# Patient Record
Sex: Male | Born: 1990 | Race: White | Hispanic: No | Marital: Single | Smoking: Former smoker
Health system: Southern US, Community
[De-identification: ages and names within clinical notes are randomized; demographics above are authoritative.]

## PROBLEM LIST (undated history)

## (undated) DIAGNOSIS — M2669 Other specified disorders of temporomandibular joint: Secondary | ICD-10-CM

## (undated) DIAGNOSIS — J342 Deviated nasal septum: Secondary | ICD-10-CM

## (undated) DIAGNOSIS — R0989 Other specified symptoms and signs involving the circulatory and respiratory systems: Secondary | ICD-10-CM

## (undated) DIAGNOSIS — E039 Hypothyroidism, unspecified: Secondary | ICD-10-CM

---

## 2010-04-22 HISTORY — PX: WISDOM TOOTH EXTRACTION: SHX21

## 2010-06-29 ENCOUNTER — Other Ambulatory Visit: Payer: Self-pay | Admitting: Family Medicine

## 2010-06-29 ENCOUNTER — Ambulatory Visit
Admission: RE | Admit: 2010-06-29 | Discharge: 2010-06-29 | Disposition: A | Discharge status: Home / Self Care | Payer: 59 | Source: Ambulatory Visit | Attending: Family Medicine | Admitting: Family Medicine

## 2010-06-29 DIAGNOSIS — M954 Acquired deformity of chest and rib: Secondary | ICD-10-CM

## 2010-07-02 ENCOUNTER — Other Ambulatory Visit (HOSPITAL_COMMUNITY): Payer: Self-pay | Admitting: Family Medicine

## 2010-07-02 DIAGNOSIS — Q766 Other congenital malformations of ribs: Secondary | ICD-10-CM

## 2010-07-06 ENCOUNTER — Ambulatory Visit (HOSPITAL_COMMUNITY)
Admission: RE | Admit: 2010-07-06 | Discharge: 2010-07-06 | Disposition: A | Discharge status: Home / Self Care | Payer: 59 | Source: Ambulatory Visit | Attending: Family Medicine | Admitting: Family Medicine

## 2010-07-06 DIAGNOSIS — Q766 Other congenital malformations of ribs: Secondary | ICD-10-CM

## 2010-07-06 DIAGNOSIS — R222 Localized swelling, mass and lump, trunk: Secondary | ICD-10-CM | POA: Insufficient documentation

## 2010-08-23 ENCOUNTER — Other Ambulatory Visit: Payer: Self-pay | Admitting: Family Medicine

## 2010-08-23 DIAGNOSIS — N62 Hypertrophy of breast: Secondary | ICD-10-CM

## 2010-08-24 ENCOUNTER — Ambulatory Visit
Admission: RE | Admit: 2010-08-24 | Discharge: 2010-08-24 | Disposition: A | Discharge status: Home / Self Care | Payer: 59 | Source: Ambulatory Visit | Attending: Family Medicine | Admitting: Family Medicine

## 2010-08-24 DIAGNOSIS — N62 Hypertrophy of breast: Secondary | ICD-10-CM

## 2011-02-27 ENCOUNTER — Ambulatory Visit (INDEPENDENT_AMBULATORY_CARE_PROVIDER_SITE_OTHER): Payer: Commercial Managed Care - PPO | Admitting: Surgery

## 2011-02-27 ENCOUNTER — Encounter (INDEPENDENT_AMBULATORY_CARE_PROVIDER_SITE_OTHER): Payer: Self-pay | Admitting: Surgery

## 2011-02-27 ENCOUNTER — Other Ambulatory Visit (INDEPENDENT_AMBULATORY_CARE_PROVIDER_SITE_OTHER): Payer: Self-pay | Admitting: Surgery

## 2011-02-27 VITALS — BP 116/68 | HR 68 | Temp 97.8°F | Resp 16 | Ht 71.0 in | Wt 136.8 lb

## 2011-02-27 DIAGNOSIS — N63 Unspecified lump in unspecified breast: Secondary | ICD-10-CM

## 2011-02-27 DIAGNOSIS — N6320 Unspecified lump in the left breast, unspecified quadrant: Secondary | ICD-10-CM | POA: Insufficient documentation

## 2011-02-27 NOTE — H&P (Signed)
Chief Complaint   Patient presents with   .  Other       Eval of left side gyne        HPI [name] is a 20 y.o. male.  He presents with a one-year history of an enlarging left breast mass. This area is minimally tender. It is mostly tender to palpation. The patient does have some asymmetry to his chest with some pectus and some tilting of his sternum. However he seems to have more fullness behind his left nipple. [facility] This shows only hypoechoic fibroglandular subareolar tissue with no mass or fat necrosis. He presents now for evaluation for removal of this breast mass. HPI   History reviewed. No pertinent past medical history.    Past Surgical History   Procedure  Date   .  Wisdom tooth extraction  2012         Family History   Problem  Relation  Age of Onset   .  Cancer  Maternal Uncle         colon,liver        Social History History   Substance Use Topics   .  Smoking status:  Passive Smoker   .  Smokeless tobacco:  Never Used   .  Alcohol Use:  No         Allergies   Allergen  Reactions   .  Penicillins  Rash         No current outpatient prescriptions on file.        Review of Systems Review of Systems ROS reviewed and is overall negative Blood pressure 116/68, pulse 68, temperature 97.8 F (36.6 C), temperature source Temporal, resp. rate 16, height 5' 11" (1.803 m), weight 136 lb 12.8 oz (62.052 kg).   Physical Exam Physical Exam WDWN in NAD HEENT:  EOMI, sclera anicteric Neck:  No masses, no thyromegaly Lungs:  CTA bilaterally; normal respiratory effort Chest: obvious asymmetry with anterior protrusion of the left chest.  There is also some soft tissue asymmetry with some more fullness behind the left areola.  At 10 o'clock in the retroareolar location of the left chest, there is a 1 cm firm palpable mass.  It seems well-demarcated with no overlying skin changes. CV:   Regular rate and rhythm; no murmurs Abd:  +bowel sounds, soft, non-tender, no masses Ext:  Well-perfused; no edema Skin:  Warm, dry; no sign of jaundice   Data Reviewed L breast ultrasound   Assessment    Left breast mass     Plan     Recommend left subcutaneous mastectomy.  The surgical procedure has been discussed with the patient.  Potential risks, benefits, alternative treatments, and expected outcomes have been explained.  All of the patient's questions at this time have been answered.  The likelihood of reaching the patient's treatment goal is good.  The patient understand the proposed surgical procedure and wishes to proceed.            [name] K. 02/27/2011, 4:22 PM             

## 2011-02-27 NOTE — Progress Notes (Signed)
Chief Complaint   Patient presents with   .  Other       Eval of left side gyne        HPI [name] is a 20 y.o. male.  He presents with a one-year history of an enlarging left breast mass. This area is minimally tender. It is mostly tender to palpation. The patient does have some asymmetry to his chest with some pectus and some tilting of his sternum. However he seems to have more fullness behind his left nipple. [facility] This shows only hypoechoic fibroglandular subareolar tissue with no mass or fat necrosis. He presents now for evaluation for removal of this breast mass. HPI   History reviewed. No pertinent past medical history.    Past Surgical History   Procedure  Date   .  Wisdom tooth extraction  2012         Family History   Problem  Relation  Age of Onset   .  Cancer  Maternal Uncle         colon,liver        Social History History   Substance Use Topics   .  Smoking status:  Passive Smoker   .  Smokeless tobacco:  Never Used   .  Alcohol Use:  No         Allergies   Allergen  Reactions   .  Penicillins  Rash         No current outpatient prescriptions on file.        Review of Systems Review of Systems ROS reviewed and is overall negative Blood pressure 116/68, pulse 68, temperature 97.8 F (36.6 C), temperature source Temporal, resp. rate 16, height 5' 11" (1.803 m), weight 136 lb 12.8 oz (62.052 kg).   Physical Exam Physical Exam WDWN in NAD HEENT:  EOMI, sclera anicteric Neck:  No masses, no thyromegaly Lungs:  CTA bilaterally; normal respiratory effort Chest: obvious asymmetry with anterior protrusion of the left chest.  There is also some soft tissue asymmetry with some more fullness behind the left areola.  At 10 o'clock in the retroareolar location of the left chest, there is a 1 cm firm palpable mass.  It seems well-demarcated with no overlying skin changes. CV:   Regular rate and rhythm; no murmurs Abd:  +bowel sounds, soft, non-tender, no masses Ext:  Well-perfused; no edema Skin:  Warm, dry; no sign of jaundice   Data Reviewed L breast ultrasound   Assessment    Left breast mass     Plan     Recommend left subcutaneous mastectomy.  The surgical procedure has been discussed with the patient.  Potential risks, benefits, alternative treatments, and expected outcomes have been explained.  All of the patient's questions at this time have been answered.  The likelihood of reaching the patient's treatment goal is good.  The patient understand the proposed surgical procedure and wishes to proceed.            Avagrace Botelho K. 02/27/2011, 4:22 PM             

## 2011-03-19 ENCOUNTER — Encounter (HOSPITAL_COMMUNITY)
Admission: RE | Admit: 2011-03-19 | Discharge: 2011-03-19 | Disposition: A | Discharge status: Home / Self Care | Payer: 59 | Source: Ambulatory Visit | Attending: Surgery | Admitting: Surgery

## 2011-03-19 ENCOUNTER — Encounter (HOSPITAL_COMMUNITY): Payer: Self-pay

## 2011-03-19 LAB — SURGICAL PCR SCREEN
MRSA, PCR: NEGATIVE
Staphylococcus aureus: POSITIVE — AB

## 2011-03-19 LAB — CBC
HCT: 43.1 % (ref 39.0–52.0)
Hemoglobin: 15.6 g/dL (ref 13.0–17.0)
RDW: 12.4 % (ref 11.5–15.5)
WBC: 5.7 10*3/uL (ref 4.0–10.5)

## 2011-03-19 NOTE — Pre-Procedure Instructions (Signed)
[name]  03/19/2011   Your procedure is scheduled on:  03/22/11  Report to [id number] AM.  Call this number if you have problems the morning of surgery: [phone]   Remember:   Do not eat food:After Midnight.  May have clear liquids: up to 4 Hours before arrival.  Clear liquids include soda, tea, black coffee, apple or grape juice, broth.  Take these medicines the morning of surgery with A SIP OF WATER: none   Do not wear jewelry, make-up or nail polish.  Do not wear lotions, powders, or perfumes. You may wear deodorant.  Do not shave 48 hours prior to surgery.  Do not bring valuables to the hospital.  Contacts, dentures or bridgework may not be worn into surgery.  Leave suitcase in the car. After surgery it may be brought to your room.  For patients admitted to the hospital, checkout time is 11:00 AM the day of discharge.   Patients discharged the day of surgery will not be allowed to drive home.  Name and phone number of your driver: douglas father [phone]  Special Instructions: CHG Shower Use Special Wash: 1/2 bottle night before surgery and 1/2 bottle morning of surgery.   Please read over the following fact sheets that you were given: Pain Booklet, Coughing and Deep Breathing, MRSA Information and Surgical Site Infection Prevention

## 2011-03-21 MED ORDER — VANCOMYCIN HCL IN DEXTROSE 1-5 GM/200ML-% IV SOLN
1000.0000 mg | INTRAVENOUS | Status: DC
  Filled 2011-03-21: qty 200

## 2011-03-22 ENCOUNTER — Other Ambulatory Visit (INDEPENDENT_AMBULATORY_CARE_PROVIDER_SITE_OTHER): Payer: Self-pay | Admitting: Surgery

## 2011-03-22 ENCOUNTER — Encounter (HOSPITAL_COMMUNITY): Payer: Self-pay | Admitting: *Deleted

## 2011-03-22 ENCOUNTER — Encounter (HOSPITAL_COMMUNITY): Payer: Self-pay | Admitting: Anesthesiology

## 2011-03-22 ENCOUNTER — Ambulatory Visit (HOSPITAL_COMMUNITY): Payer: 59 | Admitting: Anesthesiology

## 2011-03-22 ENCOUNTER — Encounter (HOSPITAL_COMMUNITY)
Admission: RE | Disposition: A | Discharge status: Home / Self Care | Payer: Self-pay | Source: Ambulatory Visit | Attending: Surgery

## 2011-03-22 ENCOUNTER — Ambulatory Visit (HOSPITAL_COMMUNITY)
Admission: RE | Admit: 2011-03-22 | Discharge: 2011-03-22 | Disposition: A | Discharge status: Home / Self Care | Payer: 59 | Source: Ambulatory Visit | Attending: Surgery | Admitting: Surgery

## 2011-03-22 DIAGNOSIS — N62 Hypertrophy of breast: Secondary | ICD-10-CM | POA: Insufficient documentation

## 2011-03-22 DIAGNOSIS — Z01812 Encounter for preprocedural laboratory examination: Secondary | ICD-10-CM | POA: Insufficient documentation

## 2011-03-22 DIAGNOSIS — N6320 Unspecified lump in the left breast, unspecified quadrant: Secondary | ICD-10-CM

## 2011-03-22 HISTORY — PX: MASTECTOMY, PARTIAL: SHX709

## 2011-03-22 SURGERY — MASTECTOMY PARTIAL
Anesthesia: General | Site: Breast | Laterality: Left | Wound class: Clean

## 2011-03-22 MED ORDER — MIDAZOLAM HCL 5 MG/5ML IJ SOLN
INTRAMUSCULAR | Status: DC | PRN
  Administered 2011-03-22: 2 mg via INTRAVENOUS

## 2011-03-22 MED ORDER — BUPIVACAINE-EPINEPHRINE 0.25% -1:200000 IJ SOLN
INTRAMUSCULAR | Status: DC | PRN
  Administered 2011-03-22: 10 mL

## 2011-03-22 MED ORDER — LACTATED RINGERS IV SOLN
INTRAVENOUS | Status: DC | PRN
  Administered 2011-03-22: 11:00:00 via INTRAVENOUS

## 2011-03-22 MED ORDER — LACTATED RINGERS IV SOLN
INTRAVENOUS | Status: DC
  Administered 2011-03-22: 11:00:00 via INTRAVENOUS

## 2011-03-22 MED ORDER — ONDANSETRON HCL 4 MG/2ML IJ SOLN
INTRAMUSCULAR | Status: DC | PRN
  Administered 2011-03-22: 4 mg via INTRAVENOUS

## 2011-03-22 MED ORDER — HYDROMORPHONE HCL PF 1 MG/ML IJ SOLN: 1.0000 mg | INTRAMUSCULAR | Status: DC | PRN

## 2011-03-22 MED ORDER — HYDROMORPHONE HCL PF 1 MG/ML IJ SOLN: 0.2500 mg | INTRAMUSCULAR | Status: DC | PRN

## 2011-03-22 MED ORDER — OXYCODONE-ACETAMINOPHEN 5-325 MG PO TABS: 1.0000 | ORAL_TABLET | ORAL | Status: AC | PRN

## 2011-03-22 MED ORDER — ONDANSETRON HCL 4 MG/2ML IJ SOLN: 4.0000 mg | INTRAMUSCULAR | Status: DC | PRN

## 2011-03-22 MED ORDER — OXYCODONE-ACETAMINOPHEN 5-325 MG PO TABS: 1.0000 | ORAL_TABLET | ORAL | Status: DC | PRN

## 2011-03-22 MED ORDER — FENTANYL CITRATE 0.05 MG/ML IJ SOLN
INTRAMUSCULAR | Status: DC | PRN
  Administered 2011-03-22 (×2): 50 ug via INTRAVENOUS
  Administered 2011-03-22: 100 ug via INTRAVENOUS
  Administered 2011-03-22: 50 ug via INTRAVENOUS

## 2011-03-22 MED ORDER — VANCOMYCIN HCL 1000 MG IV SOLR
1000.0000 mg | INTRAVENOUS | Status: DC | PRN
  Administered 2011-03-22: 1000 mg via INTRAVENOUS

## 2011-03-22 MED ORDER — MUPIROCIN 2 % EX OINT
TOPICAL_OINTMENT | CUTANEOUS | Status: AC
  Administered 2011-03-22: 10:00:00 via NASAL
  Filled 2011-03-22: qty 22

## 2011-03-22 MED ORDER — ONDANSETRON HCL 4 MG/2ML IJ SOLN: 4.0000 mg | Freq: Once | INTRAMUSCULAR | Status: DC | PRN

## 2011-03-22 MED ORDER — SODIUM CHLORIDE 0.9 % IR SOLN
Status: DC | PRN
  Administered 2011-03-22: 1000 mL

## 2011-03-22 MED ORDER — PROPOFOL 10 MG/ML IV EMUL
INTRAVENOUS | Status: DC | PRN
  Administered 2011-03-22: 200 mg via INTRAVENOUS

## 2011-03-22 SURGICAL SUPPLY — 56 items
BENZOIN TINCTURE PRP APPL 2/3 (GAUZE/BANDAGES/DRESSINGS) IMPLANT
BINDER BREAST LRG (GAUZE/BANDAGES/DRESSINGS) IMPLANT
BINDER BREAST XLRG (GAUZE/BANDAGES/DRESSINGS) IMPLANT
BLADE SURG 10 STRL SS (BLADE) IMPLANT
BLADE SURG 15 STRL LF DISP TIS (BLADE) ×1 IMPLANT
BLADE SURG 15 STRL SS (BLADE) ×1
BLADE SURG ROTATE 9660 (MISCELLANEOUS) IMPLANT
CHLORAPREP W/TINT 26ML (MISCELLANEOUS) ×2 IMPLANT
CLEANER TIP ELECTROSURG 2X2 (MISCELLANEOUS) IMPLANT
CLOTH BEACON ORANGE TIMEOUT ST (SAFETY) ×2 IMPLANT
CONT SPEC 4OZ CLIKSEAL STRL BL (MISCELLANEOUS) ×2 IMPLANT
COVER SURGICAL LIGHT HANDLE (MISCELLANEOUS) ×2 IMPLANT
DECANTER SPIKE VIAL GLASS SM (MISCELLANEOUS) ×2 IMPLANT
DEVICE DUBIN SPECIMEN MAMMOGRA (MISCELLANEOUS) IMPLANT
DRAIN PENROSE 1/2X12 LTX STRL (WOUND CARE) IMPLANT
DRAPE LAPAROTOMY TRNSV 102X78 (DRAPE) IMPLANT
DRAPE PED LAPAROTOMY (DRAPES) ×2 IMPLANT
DRAPE UTILITY 15X26 W/TAPE STR (DRAPE) ×4 IMPLANT
DRSG TEGADERM 4X4.75 (GAUZE/BANDAGES/DRESSINGS) ×2 IMPLANT
ELECT CAUTERY BLADE 6.4 (BLADE) ×2 IMPLANT
ELECT REM PT RETURN 9FT ADLT (ELECTROSURGICAL) ×2
ELECTRODE REM PT RTRN 9FT ADLT (ELECTROSURGICAL) ×1 IMPLANT
GAUZE SPONGE 2X2 8PLY STRL LF (GAUZE/BANDAGES/DRESSINGS) ×1 IMPLANT
GLOVE BIO SURGEON STRL SZ7 (GLOVE) ×2 IMPLANT
GLOVE BIOGEL PI IND STRL 7.0 (GLOVE) ×1 IMPLANT
GLOVE BIOGEL PI IND STRL 7.5 (GLOVE) ×1 IMPLANT
GLOVE BIOGEL PI INDICATOR 7.0 (GLOVE) ×1
GLOVE BIOGEL PI INDICATOR 7.5 (GLOVE) ×1
GLOVE SURG SS PI 6.5 STRL IVOR (GLOVE) ×2 IMPLANT
GOWN STRL NON-REIN LRG LVL3 (GOWN DISPOSABLE) ×4 IMPLANT
KIT BASIN OR (CUSTOM PROCEDURE TRAY) ×2 IMPLANT
KIT MARKER MARGIN INK (KITS) ×2 IMPLANT
KIT ROOM TURNOVER OR (KITS) ×2 IMPLANT
NEEDLE HYPO 25GX1X1/2 BEV (NEEDLE) ×2 IMPLANT
NS IRRIG 1000ML POUR BTL (IV SOLUTION) ×2 IMPLANT
PACK SURGICAL SETUP 50X90 (CUSTOM PROCEDURE TRAY) ×2 IMPLANT
PAD ARMBOARD 7.5X6 YLW CONV (MISCELLANEOUS) ×2 IMPLANT
PENCIL BUTTON HOLSTER BLD 10FT (ELECTRODE) ×2 IMPLANT
SPECIMEN JAR SMALL (MISCELLANEOUS) IMPLANT
SPONGE GAUZE 2X2 STER 10/PKG (GAUZE/BANDAGES/DRESSINGS) ×1
SPONGE GAUZE 4X4 12PLY (GAUZE/BANDAGES/DRESSINGS) IMPLANT
SPONGE LAP 18X18 X RAY DECT (DISPOSABLE) ×2 IMPLANT
STRIP CLOSURE SKIN 1/2X4 (GAUZE/BANDAGES/DRESSINGS) IMPLANT
SUT MNCRL AB 4-0 PS2 18 (SUTURE) ×2 IMPLANT
SUT PROLENE 2 0 SH DA (SUTURE) IMPLANT
SUT SILK 2 0 SH (SUTURE) IMPLANT
SUT SILK 3 0 (SUTURE)
SUT SILK 3-0 18XBRD TIE 12 (SUTURE) IMPLANT
SUT VIC AB 2-0 SH 27 (SUTURE)
SUT VIC AB 2-0 SH 27X BRD (SUTURE) IMPLANT
SUT VIC AB 3-0 SH 27 (SUTURE) ×1
SUT VIC AB 3-0 SH 27XBRD (SUTURE) ×1 IMPLANT
SYR CONTROL 10ML LL (SYRINGE) ×2 IMPLANT
TOWEL OR 17X24 6PK STRL BLUE (TOWEL DISPOSABLE) IMPLANT
TOWEL OR 17X26 10 PK STRL BLUE (TOWEL DISPOSABLE) ×2 IMPLANT
WATER STERILE IRR 1000ML POUR (IV SOLUTION) IMPLANT

## 2011-03-22 NOTE — Transfer of Care (Signed)
Immediate Anesthesia Transfer of Care Note  Patient: [name]  Procedure(s) Performed:  MASTECTOMY PARTIAL - Left subcutaneous mastectomy  Patient Location: PACU  Anesthesia Type: General  Level of Consciousness: awake, alert , oriented and patient cooperative  Airway & Oxygen Therapy: Patient Spontanous Breathing  Post-op Assessment: Report given to PACU RN, Post -op Vital signs reviewed and stable and Patient moving all extremities  Post vital signs: Reviewed and stable  Complications: No apparent anesthesia complications

## 2011-03-22 NOTE — Interval H&P Note (Signed)
History and Physical Interval Note:  03/22/2011 7:27 AM  [name]  has presented today for surgery, with the diagnosis of left breast mass  The various methods of treatment have been discussed with the patient and family. After consideration of risks, benefits and other options for treatment, the patient has consented to  Procedure(s): MASTECTOMY PARTIAL as a surgical intervention .  The patients' history has been reviewed, patient examined, no change in status, stable for surgery.  I have reviewed the patients' chart and labs.  Questions were answered to the patient's satisfaction.     Justine Dines K.  03/22/2011 7:27 AM

## 2011-03-22 NOTE — H&P (View-Only) (Signed)
Chief Complaint   Patient presents with   .  Other       Eval of left side gyne        HPI [name] is a 20 y.o. male.  He presents with a one-year history of an enlarging left breast mass. This area is minimally tender. It is mostly tender to palpation. The patient does have some asymmetry to his chest with some pectus and some tilting of his sternum. However he seems to have more fullness behind his left nipple. [facility] This shows only hypoechoic fibroglandular subareolar tissue with no mass or fat necrosis. He presents now for evaluation for removal of this breast mass. HPI   History reviewed. No pertinent past medical history.    Past Surgical History   Procedure  Date   .  Wisdom tooth extraction  2012         Family History   Problem  Relation  Age of Onset   .  Cancer  Maternal Uncle         colon,liver        Social History History   Substance Use Topics   .  Smoking status:  Passive Smoker   .  Smokeless tobacco:  Never Used   .  Alcohol Use:  No         Allergies   Allergen  Reactions   .  Penicillins  Rash         No current outpatient prescriptions on file.        Review of Systems Review of Systems ROS reviewed and is overall negative Blood pressure 116/68, pulse 68, temperature 97.8 F (36.6 C), temperature source Temporal, resp. rate 16, height 5\' 11"  (1.803 m), weight 136 lb 12.8 oz (62.052 kg).   Physical Exam Physical Exam WDWN in NAD HEENT:  EOMI, sclera anicteric Neck:  No masses, no thyromegaly Lungs:  CTA bilaterally; normal respiratory effort Chest: obvious asymmetry with anterior protrusion of the left chest.  There is also some soft tissue asymmetry with some more fullness behind the left areola.  At 10 o'clock in the retroareolar location of the left chest, there is a 1 cm firm palpable mass.  It seems well-demarcated with no overlying skin changes. CV:   Regular rate and rhythm; no murmurs Abd:  +bowel sounds, soft, non-tender, no masses Ext:  Well-perfused; no edema Skin:  Warm, dry; no sign of jaundice   Data Reviewed L breast ultrasound   Assessment    Left breast mass     Plan     Recommend left subcutaneous mastectomy.  The surgical procedure has been discussed with the patient.  Potential risks, benefits, alternative treatments, and expected outcomes have been explained.  All of the patient's questions at this time have been answered.  The likelihood of reaching the patient's treatment goal is good.  The patient understand the proposed surgical procedure and wishes to proceed.            [name] K. 02/27/2011, 4:22 PM

## 2011-03-22 NOTE — Anesthesia Preprocedure Evaluation (Addendum)
Anesthesia Evaluation  Patient identified by MRN, date of birth, ID band Patient awake    Reviewed: Allergy & Precautions, H&P , NPO status , Patient's Chart, lab work & pertinent test results  Airway Mallampati: I TM Distance: >3 FB Neck ROM: full    Dental  (+) Teeth Intact   Pulmonary neg pulmonary ROS,          Cardiovascular neg cardio ROS regular Normal    Neuro/Psych Negative Neurological ROS  Negative Psych ROS   GI/Hepatic negative GI ROS, Neg liver ROS,   Endo/Other  Negative Endocrine ROS  Renal/GU negative Renal ROS  Genitourinary negative   Musculoskeletal   Abdominal   Peds  Hematology negative hematology ROS (+)   Anesthesia Other Findings   Reproductive/Obstetrics                           Anesthesia Physical Anesthesia Plan  ASA: I  Anesthesia Plan: General   Post-op Pain Management:    Induction: Intravenous  Airway Management Planned: LMA  Additional Equipment:   Intra-op Plan:   Post-operative Plan: Extubation in OR  Informed Consent: I have reviewed the patients History and Physical, chart, labs and discussed the procedure including the risks, benefits and alternatives for the proposed anesthesia with the patient or authorized representative who has indicated his/her understanding and acceptance.     Plan Discussed with: Anesthesiologist, CRNA and Surgeon  Anesthesia Plan Comments:         Anesthesia Quick Evaluation

## 2011-03-22 NOTE — Op Note (Signed)
Preop diagnosis: Left breast mass Postop diagnosis: Same Procedure performed: Left subcutaneous mastectomy Surgeon: Manus Rudd K. Anesthesia: Gen. via LMA Indications: This is a 20 year old male in good health who presents with a one-year history of an enlarging left breast mass behind the nipple. This has caused some tenderness with palpation. An ultrasound showed only hypoechoic fibroglandular subareolar tissue. He presents now for subcutaneous mastectomy for diagnosis.  Description of procedure: The patient was brought to the operating room and placed in a supine position on the operating room table. After an adequate level of general anesthesia was obtained, his left chest was prepped with chlor prep and draped in sterile fashion. A timeout was taken to ensure the proper patient proper procedure. We made a transverse incision just below the left nipple. We raised subcutaneous flaps in all directions. I dissected across the anterior surface of all of his breast tissue down to the muscle superiorly. We then found the lower edge of his breast tissue and dissected the breast tissue off of the pectoralis muscle. The specimen was removed and oriented with a paint kit.  The wound was inspected for hemostasis. We tacked the posterior surface of the nipple down to the extraosseous fashion. We then closed the incision with a deep layer of 3-0 Vicryl which was tacked down to the fascia. A 4-0 Monocryl was used to close the skin in subcuticular fashion. Steri-Strips and an occlusive dressing were applied. The patient was then extubated and brought to the recovery room in stable condition. All sponge, and Schuman, and needle counts were correct.

## 2011-03-22 NOTE — Anesthesia Postprocedure Evaluation (Signed)
  Anesthesia Post-op Note  Patient: [name]  Procedure(s) Performed:  MASTECTOMY PARTIAL - Left subcutaneous mastectomy  Patient Location: PACU  Anesthesia Type: General  Level of Consciousness: awake, alert , oriented and patient cooperative  Airway and Oxygen Therapy: Patient Spontanous Breathing and Patient connected to nasal cannula oxygen  Post-op Pain: mild  Post-op Assessment: Post-op Vital signs reviewed, Patient's Cardiovascular Status Stable, Respiratory Function Stable, Patent Airway, No signs of Nausea or vomiting and Pain level controlled  Post-op Vital Signs: stable  Complications: No apparent anesthesia complications

## 2011-03-25 NOTE — Progress Notes (Signed)
Quick Note:  Please call the patient and let them know that their labs/ path are normal. No sign of cancer. ______

## 2011-03-26 ENCOUNTER — Encounter (HOSPITAL_COMMUNITY): Payer: Self-pay | Admitting: Surgery

## 2011-06-21 DIAGNOSIS — J342 Deviated nasal septum: Secondary | ICD-10-CM

## 2011-06-21 DIAGNOSIS — M2669 Other specified disorders of temporomandibular joint: Secondary | ICD-10-CM

## 2011-06-21 HISTORY — DX: Deviated nasal septum: J34.2

## 2011-06-21 HISTORY — DX: Other specified disorders of temporomandibular joint: M26.69

## 2011-06-24 ENCOUNTER — Encounter (HOSPITAL_BASED_OUTPATIENT_CLINIC_OR_DEPARTMENT_OTHER): Payer: Self-pay | Admitting: *Deleted

## 2011-06-24 DIAGNOSIS — R0989 Other specified symptoms and signs involving the circulatory and respiratory systems: Secondary | ICD-10-CM

## 2011-06-24 HISTORY — DX: Other specified symptoms and signs involving the circulatory and respiratory systems: R09.89

## 2011-07-01 ENCOUNTER — Encounter (HOSPITAL_BASED_OUTPATIENT_CLINIC_OR_DEPARTMENT_OTHER): Payer: Self-pay | Admitting: *Deleted

## 2011-07-01 ENCOUNTER — Encounter (HOSPITAL_BASED_OUTPATIENT_CLINIC_OR_DEPARTMENT_OTHER): Payer: Self-pay | Admitting: Certified Registered"

## 2011-07-01 ENCOUNTER — Ambulatory Visit (HOSPITAL_BASED_OUTPATIENT_CLINIC_OR_DEPARTMENT_OTHER): Payer: 59 | Admitting: Certified Registered"

## 2011-07-01 ENCOUNTER — Encounter (HOSPITAL_BASED_OUTPATIENT_CLINIC_OR_DEPARTMENT_OTHER)
Admission: RE | Disposition: A | Discharge status: Home / Self Care | Payer: Self-pay | Source: Ambulatory Visit | Attending: Otolaryngology

## 2011-07-01 ENCOUNTER — Encounter (HOSPITAL_BASED_OUTPATIENT_CLINIC_OR_DEPARTMENT_OTHER): Payer: Self-pay | Admitting: Anesthesiology

## 2011-07-01 ENCOUNTER — Ambulatory Visit (HOSPITAL_BASED_OUTPATIENT_CLINIC_OR_DEPARTMENT_OTHER)
Admission: RE | Admit: 2011-07-01 | Discharge: 2011-07-01 | Disposition: A | Discharge status: Home / Self Care | Payer: 59 | Source: Ambulatory Visit | Attending: Otolaryngology | Admitting: Otolaryngology

## 2011-07-01 DIAGNOSIS — W219XXA Striking against or struck by unspecified sports equipment, initial encounter: Secondary | ICD-10-CM | POA: Insufficient documentation

## 2011-07-01 DIAGNOSIS — Z01812 Encounter for preprocedural laboratory examination: Secondary | ICD-10-CM | POA: Insufficient documentation

## 2011-07-01 DIAGNOSIS — Y998 Other external cause status: Secondary | ICD-10-CM | POA: Insufficient documentation

## 2011-07-01 DIAGNOSIS — Y9375 Activity, martial arts: Secondary | ICD-10-CM | POA: Insufficient documentation

## 2011-07-01 DIAGNOSIS — Z87891 Personal history of nicotine dependence: Secondary | ICD-10-CM | POA: Insufficient documentation

## 2011-07-01 DIAGNOSIS — J342 Deviated nasal septum: Secondary | ICD-10-CM | POA: Insufficient documentation

## 2011-07-01 HISTORY — DX: Deviated nasal septum: J34.2

## 2011-07-01 HISTORY — DX: Other specified symptoms and signs involving the circulatory and respiratory systems: R09.89

## 2011-07-01 HISTORY — PX: SEPTOPLASTY: SHX2393

## 2011-07-01 HISTORY — DX: Other specified disorders of temporomandibular joint: M26.69

## 2011-07-01 LAB — POCT HEMOGLOBIN-HEMACUE: Hemoglobin: 15.3 g/dL (ref 13.0–17.0)

## 2011-07-01 SURGERY — SEPTOPLASTY, NOSE
Anesthesia: General | Site: Nose | Laterality: Bilateral | Wound class: Clean Contaminated

## 2011-07-01 MED ORDER — PROPOFOL 10 MG/ML IV EMUL
INTRAVENOUS | Status: DC | PRN
  Administered 2011-07-01: 200 mg via INTRAVENOUS

## 2011-07-01 MED ORDER — EPHEDRINE SULFATE 50 MG/ML IJ SOLN
INTRAMUSCULAR | Status: DC | PRN
  Administered 2011-07-01: 10 mg via INTRAVENOUS

## 2011-07-01 MED ORDER — DEXAMETHASONE SODIUM PHOSPHATE 4 MG/ML IJ SOLN
INTRAMUSCULAR | Status: DC | PRN
  Administered 2011-07-01: 10 mg via INTRAVENOUS

## 2011-07-01 MED ORDER — OXYMETAZOLINE HCL 0.05 % NA SOLN
NASAL | Status: DC | PRN
  Administered 2011-07-01: 1 via NASAL

## 2011-07-01 MED ORDER — MUPIROCIN 2 % EX OINT
TOPICAL_OINTMENT | CUTANEOUS | Status: DC | PRN
  Administered 2011-07-01: 1 via NASAL

## 2011-07-01 MED ORDER — FENTANYL CITRATE 0.05 MG/ML IJ SOLN
INTRAMUSCULAR | Status: DC | PRN
  Administered 2011-07-01: 100 ug via INTRAVENOUS

## 2011-07-01 MED ORDER — LIDOCAINE-EPINEPHRINE 1 %-1:100000 IJ SOLN
INTRAMUSCULAR | Status: DC | PRN
  Administered 2011-07-01: 5 mL

## 2011-07-01 MED ORDER — ONDANSETRON HCL 4 MG/2ML IJ SOLN
INTRAMUSCULAR | Status: DC | PRN
  Administered 2011-07-01: 4 mg via INTRAVENOUS

## 2011-07-01 MED ORDER — HYDROCODONE-ACETAMINOPHEN 5-500 MG PO TABS: 1.0000 | ORAL_TABLET | ORAL | Status: AC | PRN

## 2011-07-01 MED ORDER — SUCCINYLCHOLINE CHLORIDE 20 MG/ML IJ SOLN
INTRAMUSCULAR | Status: DC | PRN
  Administered 2011-07-01: 120 mg via INTRAVENOUS

## 2011-07-01 MED ORDER — HYDROMORPHONE HCL PF 1 MG/ML IJ SOLN: 0.2500 mg | INTRAMUSCULAR | Status: DC | PRN

## 2011-07-01 MED ORDER — CLINDAMYCIN PHOSPHATE 600 MG/50ML IV SOLN
INTRAVENOUS | Status: DC | PRN
  Administered 2011-07-01: 600 mg via INTRAVENOUS

## 2011-07-01 MED ORDER — MIDAZOLAM HCL 5 MG/5ML IJ SOLN
INTRAMUSCULAR | Status: DC | PRN
  Administered 2011-07-01: 2 mg via INTRAVENOUS

## 2011-07-01 MED ORDER — LIDOCAINE HCL (CARDIAC) 20 MG/ML IV SOLN
INTRAVENOUS | Status: DC | PRN
  Administered 2011-07-01: 20 mg via INTRAVENOUS

## 2011-07-01 MED ORDER — LACTATED RINGERS IV SOLN
INTRAVENOUS | Status: DC
  Administered 2011-07-01 (×2): via INTRAVENOUS

## 2011-07-01 SURGICAL SUPPLY — 32 items
ATTRACTOMAT 16X20 MAGNETIC DRP (DRAPES) ×2 IMPLANT
CANISTER SUCTION 1200CC (MISCELLANEOUS) ×2 IMPLANT
CLOTH BEACON ORANGE TIMEOUT ST (SAFETY) ×2 IMPLANT
COAGULATOR SUCT SWTCH 10FR 6 (ELECTROSURGICAL) IMPLANT
CORDS BIPOLAR (ELECTRODE) IMPLANT
DECANTER SPIKE VIAL GLASS SM (MISCELLANEOUS) IMPLANT
ELECT REM PT RETURN 9FT ADLT (ELECTROSURGICAL)
ELECTRODE REM PT RTRN 9FT ADLT (ELECTROSURGICAL) IMPLANT
GLOVE BIO SURGEON STRL SZ 6.5 (GLOVE) ×2 IMPLANT
GLOVE BIO SURGEON STRL SZ7.5 (GLOVE) ×2 IMPLANT
GLOVE INDICATOR 7.0 STRL GRN (GLOVE) ×2 IMPLANT
GOWN PREVENTION PLUS XLARGE (GOWN DISPOSABLE) ×4 IMPLANT
HEMOSTAT SURGICEL .5X2 ABSORB (HEMOSTASIS) IMPLANT
NEEDLE 27GAX1X1/2 (NEEDLE) ×2 IMPLANT
NS IRRIG 1000ML POUR BTL (IV SOLUTION) IMPLANT
PACK BASIN DAY SURGERY FS (CUSTOM PROCEDURE TRAY) ×2 IMPLANT
PACK ENT DAY SURGERY (CUSTOM PROCEDURE TRAY) ×2 IMPLANT
PATTIES SURGICAL .5 X3 (DISPOSABLE) ×2 IMPLANT
SHEET SILASTIC 8X6X.030 25-30 (MISCELLANEOUS) IMPLANT
SPLINT NASAL DOYLE BI-VL (GAUZE/BANDAGES/DRESSINGS) ×2 IMPLANT
SPONGE GAUZE 2X2 12PLY UNSTER (GAUZE/BANDAGES/DRESSINGS) ×2 IMPLANT
SPONGE GAUZE 2X2 8PLY STRL LF (GAUZE/BANDAGES/DRESSINGS) ×2 IMPLANT
SUT CHROMIC 2 0 SH (SUTURE) ×2 IMPLANT
SUT CHROMIC 4 0 P 3 18 (SUTURE) ×2 IMPLANT
SUT ETHILON 3 0 PS 1 (SUTURE) IMPLANT
SUT PLAIN 4 0 ~~LOC~~ 1 (SUTURE) IMPLANT
TAPE SURG TRANSPORE 1 IN (GAUZE/BANDAGES/DRESSINGS) ×1 IMPLANT
TAPE SURGICAL TRANSPORE 1 IN (GAUZE/BANDAGES/DRESSINGS) ×1
TOWEL OR 17X24 6PK STRL BLUE (TOWEL DISPOSABLE) ×4 IMPLANT
TRAY DSU PREP LF (CUSTOM PROCEDURE TRAY) ×2 IMPLANT
WATER STERILE IRR 1000ML POUR (IV SOLUTION) IMPLANT
YANKAUER SUCT BULB TIP NO VENT (SUCTIONS) ×2 IMPLANT

## 2011-07-01 NOTE — Transfer of Care (Signed)
Immediate Anesthesia Transfer of Care Note  Patient: [name]  Procedure(s) Performed: Procedure(s) (LRB): SEPTOPLASTY (Bilateral)  Patient Location: PACU  Anesthesia Type: General  Level of Consciousness: sedated  Airway & Oxygen Therapy: Patient Spontanous Breathing and Patient connected to face mask oxygen  Post-op Assessment: Report given to PACU RN and Post -op Vital signs reviewed and stable  Post vital signs: Reviewed and stable  Complications: No apparent anesthesia complications

## 2011-07-01 NOTE — Brief Op Note (Signed)
07/01/2011  11:11 AM  PATIENT:  [name]  20 y.o. male  PRE-OPERATIVE DIAGNOSIS:  septal deviation  POST-OPERATIVE DIAGNOSIS:  septal deviation  PROCEDURE:  Procedure(s) (LRB): SEPTOPLASTY (Bilateral)  SURGEON:  Surgeon(s) and Role:    * Christia Reading, MD - Primary  PHYSICIAN ASSISTANT:   ASSISTANTS: none   ANESTHESIA:   general  EBL:  Total I/O In: 1000 [I.V.:1000] Out: -   BLOOD ADMINISTERED:none  DRAINS: none   LOCAL MEDICATIONS USED:  LIDOCAINE   SPECIMEN:  No Specimen  DISPOSITION OF SPECIMEN:  N/A  COUNTS:  YES  TOURNIQUET:  * No tourniquets in log *  DICTATION: .Other Dictation: Dictation Number [id number]  PLAN OF CARE: Discharge to home after PACU  PATIENT DISPOSITION:  PACU - hemodynamically stable.   Delay start of Pharmacological VTE agent (>24hrs) due to surgical blood loss or risk of bleeding: yes

## 2011-07-01 NOTE — Anesthesia Postprocedure Evaluation (Signed)
  Anesthesia Post-op Note  Patient: [name]  Procedure(s) Performed: Procedure(s) (LRB): SEPTOPLASTY (Bilateral)  Patient Location: PACU  Anesthesia Type: General  Level of Consciousness: awake, alert  and oriented  Airway and Oxygen Therapy: Patient Spontanous Breathing  Post-op Pain: none  Post-op Assessment: Post-op Vital signs reviewed, Patient's Cardiovascular Status Stable, Respiratory Function Stable, Patent Airway, No signs of Nausea or vomiting, Adequate PO intake and Pain level controlled  Post-op Vital Signs: Reviewed and stable  Complications: No apparent anesthesia complications
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
## 2011-07-01 NOTE — Anesthesia Preprocedure Evaluation (Signed)
Anesthesia Evaluation  Patient identified by MRN, date of birth, ID band Patient awake    Reviewed: Allergy & Precautions, H&P , NPO status , Patient's Chart, lab work & pertinent test results  Airway Mallampati: II TM Distance: >3 FB Neck ROM: Full    Dental No notable dental hx. (+) Teeth Intact and Dental Advisory Given   Pulmonary former smoker breath sounds clear to auscultation  Pulmonary exam normal       Cardiovascular negative cardio ROS  Rhythm:Regular Rate:Normal     Neuro/Psych negative neurological ROS     GI/Hepatic negative GI ROS, Neg liver ROS,   Endo/Other  negative endocrine ROS  Renal/GU negative Renal ROS     Musculoskeletal   Abdominal   Peds  Hematology negative hematology ROS (+)   Anesthesia Other Findings   Reproductive/Obstetrics                           Anesthesia Physical Anesthesia Plan  ASA: II  Anesthesia Plan: General   Post-op Pain Management:    Induction: Intravenous  Airway Management Planned: Oral ETT  Additional Equipment:   Intra-op Plan:   Post-operative Plan: Extubation in OR  Informed Consent: I have reviewed the patients History and Physical, chart, labs and discussed the procedure including the risks, benefits and alternatives for the proposed anesthesia with the patient or authorized representative who has indicated his/her understanding and acceptance.   Dental advisory given and Only emergency history available  Plan Discussed with: CRNA and Surgeon  Anesthesia Plan Comments: (Plan routine monitors, GETA)        Anesthesia Quick Evaluation

## 2011-07-01 NOTE — Anesthesia Procedure Notes (Signed)
Procedure Name: Intubation Date/Time: 07/01/2011 10:42 AM Performed by: Verlan Friends Pre-anesthesia Checklist: Patient identified, Emergency Drugs available, Suction available, Patient being monitored and Timeout performed Patient Re-evaluated:Patient Re-evaluated prior to inductionOxygen Delivery Method: Circle System Utilized Preoxygenation: Pre-oxygenation with 100% oxygen Intubation Type: IV induction Ventilation: Mask ventilation without difficulty Laryngoscope Size: Miller and 3 Grade View: Grade I Tube type: Oral Tube size: 7.0 mm Number of attempts: 1 (atraumatic) Airway Equipment and Method: stylet Placement Confirmation: ETT inserted through vocal cords under direct vision,  positive ETCO2 and breath sounds checked- equal and bilateral Secured at: 22 cm Tube secured with: Tape (pink tape) Dental Injury: Teeth and Oropharynx as per pre-operative assessment

## 2011-07-02 NOTE — Op Note (Signed)
[name], [name] NO.:  [id number]  MEDICAL RECORD NO.:  [id number]  LOCATION:  [facility]                         FACILITY:  [facility]  PHYSICIAN:  Colin Contras, MD     DATE OF BIRTH:  1990-09-15  DATE OF PROCEDURE:  07/01/2011 DATE OF DISCHARGE:  03/22/2011                              OPERATIVE REPORT   PREOPERATIVE DIAGNOSIS:  Nasal septal deviation.  POSTOPERATIVE DIAGNOSIS:  Nasal septal deviation.  PROCEDURE:  Nasal septoplasty.  SURGEON:  Colin Contras, MD  ANESTHESIA:  General endotracheal anesthesia.  COMPLICATIONS:  None.  INDICATION:  The patient is a 21 year old male who kneed himself on the nose in July during martial arts training.  His nose cracked and his eyes watered, but he did not bleed.  The nose looked fine after that except that on the next day, he was struck again on the nose from the right side causing a similar response, but with the nose now deviated toward the left.  Since then, he has had difficulty breathing through the left side of his nose.  He was found to have a deviated septum as well as deviated external nose.  The patient expressed that he is not concerned about the external appearance, but wishes to breathe better, that is why he presents to the operating room for septoplasty.  FINDINGS:  As above.  DESCRIPTION OF PROCEDURE:  The patient was identified in the holding room and informed consent having been obtained including discussion of risks, benefits, and alternatives, the patient was brought to the operative suite and put on the operative table in supine position. Anesthesia was induced, and the patient was intubated by the anesthesia team without difficulty.  The patient was given intravenous antibiotics during the case.  The were eyes taped closed, and the nose was prepped and draped in sterile fashion.  Afrin pledgets were placed on both sides of the nose for several minutes and then removed.  The nasal septum  was then injected with 1% lidocaine with 1:100,000 epinephrine on both sides.  A left-sided Killian incision was made using 15-blade scalpel and a subperichondrial flap was then elevated back beyond the bony cartilaginous junction.  The junction was divided and the posterior septal bone was then removed in a piecemeal fashion.  The inferior portion of the septum was then cleared off of soft tissues and the maxillary spine was then removed using an osteotome.  This dissection was continued anteriorly until the bony spurs on the left and the right were removed.  A small sliver of cartilage was removed from the inferior quadrangular cartilage as well.  The majority of the septal cartilage was left in place.  After this was completed, the septum was found to be in a good midline position.  The left side flap was then punctured with a 15-blade scalpel and Doyle stents were then placed on both sides of the nose.  The incision was closed with 4-0 chromic in a simple interrupted fashion.  The stents were then placed into proper position and secured with a single 2-0 chromic suture and a through-and-through mattress stitch.  The throat was suctioned, and the patient returned to anesthesia  for wake-up, was extubated, and moved to the recovery room in stable condition.     Colin Contras, MD     DDB/MEDQ  D:  07/01/2011  T:  07/02/2011  Job:  [id number]

## 2011-07-03 ENCOUNTER — Encounter (HOSPITAL_BASED_OUTPATIENT_CLINIC_OR_DEPARTMENT_OTHER): Payer: Self-pay | Admitting: Otolaryngology

## 2015-02-20 ENCOUNTER — Ambulatory Visit (INDEPENDENT_AMBULATORY_CARE_PROVIDER_SITE_OTHER): Payer: 59 | Admitting: Family Medicine

## 2015-02-20 ENCOUNTER — Ambulatory Visit (INDEPENDENT_AMBULATORY_CARE_PROVIDER_SITE_OTHER): Payer: 59

## 2015-02-20 VITALS — BP 100/68 | HR 83 | Temp 98.3°F | Resp 18 | Ht 70.0 in | Wt 146.8 lb

## 2015-02-20 DIAGNOSIS — S60221A Contusion of right hand, initial encounter: Secondary | ICD-10-CM

## 2015-02-20 DIAGNOSIS — S92301A Fracture of unspecified metatarsal bone(s), right foot, initial encounter for closed fracture: Secondary | ICD-10-CM

## 2015-02-20 DIAGNOSIS — S62396A Other fracture of fifth metacarpal bone, right hand, initial encounter for closed fracture: Secondary | ICD-10-CM | POA: Diagnosis not present

## 2015-02-20 DIAGNOSIS — Z23 Encounter for immunization: Secondary | ICD-10-CM

## 2015-02-20 DIAGNOSIS — M79641 Pain in right hand: Secondary | ICD-10-CM | POA: Diagnosis not present

## 2015-02-20 NOTE — Patient Instructions (Addendum)
You do have a fracture of the fifth right hand bone  Leave the splint in place, and use ibuprofen and/ or tylenol as needed for pain If you have any change or worsening please let us know  [facility] on Wednesday 11/2 at 1:30 pm with Dr. Zachery DauerBarnes  [address] [place], [place] [address] Phone  [phone]

## 2015-02-20 NOTE — Progress Notes (Signed)
[facility] [address], [place] [address] [phone]  Date:  02/20/2015   Name:  [name]   DOB:  1990-09-14   MRN:  [id number]  PCP:  Leanor RubensteinSUN,VYVYAN Y, MD    Chief Complaint: Hand Injury   History of Present Illness:  [name] is a 24 y.o. very pleasant male patient who presents with the following:  He hurt his right hand 2 days ago.  He assumes that he fell, but admits that he was drinking and he is not quite sure what happened.  He has abit of an abrasion on the knuckles. It is still painful, he cannot fully extend the pinky finger.  His hand still hurts so he decided to come in for evaluation  He is otherwise unhurt He is generally in good heatlh  He is unsure of his last tetanus shot  Patient Active Problem List   Diagnosis Date Noted  . 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 Left breast mass 02/27/2011    Past Medical History  Diagnosis Date  . 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 Runny nose 06/24/2011  . 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 Wisdom tooth extraction  2012  . Mastectomy, partial  03/22/2011    Procedure: MASTECTOMY PARTIAL;  Surgeon: Wilmon ArmsMatthew K. Corliss Skainssuei, MD;  Location: [facility] OR;  Service: General;  Laterality: Left;  Left subcutaneous mastectomy  . Septoplasty  07/01/2011    Procedure: SEPTOPLASTY;  Surgeon: Christia Readingwight Bates, MD;  Location: [facility];  Service: ENT;  Laterality: Bilateral;    Social History  Substance Use Topics  . Smoking status: Former Smoker    Quit date: 10/20/2010  . Smokeless tobacco: Never Used  . Alcohol Use: No    Family History  Problem Relation Age of Onset  . Cancer Maternal Uncle     colon,liver    Allergies  Allergen Reactions  . Penicillins Rash    Medication list has been reviewed and updated.  No current outpatient prescriptions on file prior to visit.   No current  facility-administered medications on file prior to visit.    Review of Systems:  As per HPI- otherwise negative.   Physical Examination: Filed Vitals:   02/20/15 1158  BP: 100/68  Pulse: 83  Temp: 98.3 F (36.8 C)  Resp: 18   Filed Vitals:   02/20/15 1158  Height: 5\' 10"  (1.778 m)  Weight: 146 lb 12.8 oz (66.588 kg)   Body mass index is 21.06 kg/(m^2). Ideal Body Weight: Weight in (lb) to have BMI = 25: 173.9  GEN: WDWN, NAD, Non-toxic, A & O x 3, looks well HEENT: Atraumatic, Normocephalic. Neck supple. No masses, No LAD. Ears and Nose: No external deformity. CV: RRR, No M/G/R. No JVD. No thrill. No extra heart sounds. PULM: CTA B, no wheezes, crackles, rhonchi. No retractions. No resp. distress. No accessory muscle use. EXTR: No c/c/e NEURO Normal gait.  PSYCH: Normally interactive. Conversant. Not depressed or anxious appearing.  Calm demeanor. Right hand: there is swelling and bruising over the 4th and 5th MC.  Pinky finger also bruised. The rest of the hand, wrist, forearm and elbow negative.  Hand is NV intact.  He is able to make a composite fist Mild abrasions over knuckles of right hand He is right handed   [facility] reading (PRIMARY) by  Dr. Patsy Lageropland. Right hand: boxer's fracture  of 5th MT- angulated. COMPARISON: None.  FINDINGS: Angulated fracture of the distal right fifth metacarpal. No other focal abnormality identified.  IMPRESSION: Angulated fracture of the distal right fifth metacarpal.  Put into an ulnar gutter splint Assessment and Plan: Fracture of 5th metatarsal, right, closed, initial encounter - Plan: Ambulatory referral to Orthopedic Surgery  Hand contusion, right, initial encounter - Plan: DG Hand Complete Right  Right hand pain  Immunization due  Need for prophylactic vaccination with combined diphtheria-tetanus-pertussis (DTP) vaccine - Plan: Tdap vaccine greater than or equal to 7yo IM  Here today with fracture of the right 5th  MC Placed in an ulnar gutter splint, he will see ortho in a couple of days tdap updated  Signed Abbe Amsterdam, MD

## 2015-02-20 NOTE — Progress Notes (Signed)
Ulnar gutter splint applied to right arm per Dr. Cyndie Chimeopland's verbal order.  Colin BostonMichael Raushanah Osmundson, MS, PA-C   1:56 PM, 02/20/2015

## 2018-03-05 DIAGNOSIS — J342 Deviated nasal septum: Secondary | ICD-10-CM | POA: Diagnosis not present

## 2018-03-05 DIAGNOSIS — J343 Hypertrophy of nasal turbinates: Secondary | ICD-10-CM | POA: Diagnosis not present

## 2018-03-05 DIAGNOSIS — H6121 Impacted cerumen, right ear: Secondary | ICD-10-CM | POA: Diagnosis not present

## 2018-12-24 DIAGNOSIS — L219 Seborrheic dermatitis, unspecified: Secondary | ICD-10-CM | POA: Diagnosis not present

## 2019-04-11 DIAGNOSIS — Z20828 Contact with and (suspected) exposure to other viral communicable diseases: Secondary | ICD-10-CM | POA: Diagnosis not present

## 2019-04-11 DIAGNOSIS — Z03818 Encounter for observation for suspected exposure to other biological agents ruled out: Secondary | ICD-10-CM | POA: Diagnosis not present

## 2019-06-09 DIAGNOSIS — M25531 Pain in right wrist: Secondary | ICD-10-CM | POA: Diagnosis not present

## 2019-06-09 DIAGNOSIS — M79641 Pain in right hand: Secondary | ICD-10-CM | POA: Diagnosis not present

## 2019-07-08 DIAGNOSIS — Z20828 Contact with and (suspected) exposure to other viral communicable diseases: Secondary | ICD-10-CM | POA: Diagnosis not present

## 2020-01-25 ENCOUNTER — Other Ambulatory Visit: Payer: Self-pay | Admitting: Orthopedic Surgery

## 2020-01-25 DIAGNOSIS — S63091A Other subluxation of right wrist and hand, initial encounter: Secondary | ICD-10-CM | POA: Diagnosis not present

## 2020-01-25 DIAGNOSIS — M79641 Pain in right hand: Secondary | ICD-10-CM | POA: Diagnosis not present

## 2020-01-25 DIAGNOSIS — S6981XA Other specified injuries of right wrist, hand and finger(s), initial encounter: Secondary | ICD-10-CM

## 2020-02-22 ENCOUNTER — Ambulatory Visit
Admission: RE | Admit: 2020-02-22 | Discharge: 2020-02-22 | Disposition: A | Discharge status: Home / Self Care | Payer: BC Managed Care – PPO | Source: Ambulatory Visit | Attending: Orthopedic Surgery | Admitting: Orthopedic Surgery

## 2020-02-22 ENCOUNTER — Other Ambulatory Visit: Payer: Self-pay

## 2020-02-22 ENCOUNTER — Ambulatory Visit
Admission: RE | Admit: 2020-02-22 | Discharge: 2020-02-22 | Disposition: A | Discharge status: Home / Self Care | Payer: 59 | Source: Ambulatory Visit | Attending: Orthopedic Surgery | Admitting: Orthopedic Surgery

## 2020-02-22 DIAGNOSIS — S63591A Other specified sprain of right wrist, initial encounter: Secondary | ICD-10-CM | POA: Diagnosis not present

## 2020-02-22 DIAGNOSIS — M25531 Pain in right wrist: Secondary | ICD-10-CM | POA: Diagnosis not present

## 2020-02-22 DIAGNOSIS — S6981XA Other specified injuries of right wrist, hand and finger(s), initial encounter: Secondary | ICD-10-CM

## 2020-02-22 DIAGNOSIS — S63091A Other subluxation of right wrist and hand, initial encounter: Secondary | ICD-10-CM

## 2020-02-22 MED ORDER — IOPAMIDOL (ISOVUE-M 200) INJECTION 41%
1.5000 mL | Freq: Once | INTRAMUSCULAR | Status: AC
  Administered 2020-02-22: 1.5 mL via INTRA_ARTICULAR

## 2020-03-20 DIAGNOSIS — L718 Other rosacea: Secondary | ICD-10-CM | POA: Diagnosis not present

## 2021-06-27 ENCOUNTER — Ambulatory Visit (INDEPENDENT_AMBULATORY_CARE_PROVIDER_SITE_OTHER): Payer: 59 | Admitting: Internal Medicine

## 2021-06-27 ENCOUNTER — Encounter: Payer: Self-pay | Admitting: Internal Medicine

## 2021-06-27 ENCOUNTER — Other Ambulatory Visit: Payer: Self-pay

## 2021-06-27 VITALS — BP 116/70 | HR 72 | Temp 97.8°F | Ht 70.0 in | Wt 164.2 lb

## 2021-06-27 DIAGNOSIS — R748 Abnormal levels of other serum enzymes: Secondary | ICD-10-CM

## 2021-06-27 DIAGNOSIS — R6889 Other general symptoms and signs: Secondary | ICD-10-CM

## 2021-06-27 DIAGNOSIS — Z1159 Encounter for screening for other viral diseases: Secondary | ICD-10-CM

## 2021-06-27 DIAGNOSIS — E559 Vitamin D deficiency, unspecified: Secondary | ICD-10-CM

## 2021-06-27 DIAGNOSIS — Z0001 Encounter for general adult medical examination with abnormal findings: Secondary | ICD-10-CM

## 2021-06-27 DIAGNOSIS — R1030 Lower abdominal pain, unspecified: Secondary | ICD-10-CM | POA: Insufficient documentation

## 2021-06-27 DIAGNOSIS — R7989 Other specified abnormal findings of blood chemistry: Secondary | ICD-10-CM | POA: Diagnosis not present

## 2021-06-27 DIAGNOSIS — D539 Nutritional anemia, unspecified: Secondary | ICD-10-CM

## 2021-06-27 DIAGNOSIS — E039 Hypothyroidism, unspecified: Secondary | ICD-10-CM

## 2021-06-27 LAB — LIPID PANEL
Cholesterol: 279 mg/dL — ABNORMAL HIGH (ref 0–200)
HDL: 89.2 mg/dL (ref >39.00)
LDL Cholesterol: 157 mg/dL — ABNORMAL HIGH (ref 0–99)
NonHDL: 189.82
Total CHOL/HDL Ratio: 3
Triglycerides: 164 mg/dL — ABNORMAL HIGH (ref 0.0–149.0)
VLDL: 32.8 mg/dL (ref 0.0–40.0)

## 2021-06-27 LAB — CBC WITH DIFFERENTIAL/PLATELET
Basophils Absolute: 0.1 10*3/uL (ref 0.0–0.1)
Basophils Relative: 1.2 % (ref 0.0–3.0)
Eosinophils Absolute: 0.1 10*3/uL (ref 0.0–0.7)
Eosinophils Relative: 1.4 % (ref 0.0–5.0)
HCT: 36.6 % — ABNORMAL LOW (ref 39.0–52.0)
Hemoglobin: 12.8 g/dL — ABNORMAL LOW (ref 13.0–17.0)
Lymphocytes Relative: 26.3 % (ref 12.0–46.0)
Lymphs Abs: 1.5 10*3/uL (ref 0.7–4.0)
MCHC: 34.9 g/dL (ref 30.0–36.0)
MCV: 90.6 fl (ref 78.0–100.0)
Monocytes Absolute: 0.4 10*3/uL (ref 0.1–1.0)
Monocytes Relative: 6.5 % (ref 3.0–12.0)
Neutro Abs: 3.6 10*3/uL (ref 1.4–7.7)
Neutrophils Relative %: 64.6 % (ref 43.0–77.0)
Platelets: 204 10*3/uL (ref 150.0–400.0)
RBC: 4.04 Mil/uL — ABNORMAL LOW (ref 4.22–5.81)
RDW: 13.1 % (ref 11.5–15.5)
WBC: 5.6 10*3/uL (ref 4.0–10.5)

## 2021-06-27 LAB — URINALYSIS, ROUTINE W REFLEX MICROSCOPIC
Bilirubin Urine: NEGATIVE
Hgb urine dipstick: NEGATIVE
Ketones, ur: NEGATIVE
Leukocytes,Ua: NEGATIVE
Nitrite: NEGATIVE
RBC / HPF: NONE SEEN (ref 0–?)
Specific Gravity, Urine: 1.015 (ref 1.000–1.030)
Total Protein, Urine: NEGATIVE
Urine Glucose: NEGATIVE
Urobilinogen, UA: 0.2 (ref 0.0–1.0)
pH: 7.5 (ref 5.0–8.0)

## 2021-06-27 LAB — BASIC METABOLIC PANEL
BUN: 15 mg/dL (ref 6–23)
CO2: 30 mEq/L (ref 19–32)
Calcium: 10.6 mg/dL — ABNORMAL HIGH (ref 8.4–10.5)
Chloride: 98 mEq/L (ref 96–112)
Creatinine, Ser: 1.47 mg/dL (ref 0.40–1.50)
GFR: 63.53 mL/min (ref >60.00)
Glucose, Bld: 87 mg/dL (ref 70–99)
Potassium: 3.8 mEq/L (ref 3.5–5.1)
Sodium: 137 mEq/L (ref 135–145)

## 2021-06-27 LAB — HEPATIC FUNCTION PANEL
ALT: 131 U/L — ABNORMAL HIGH (ref 0–53)
AST: 90 U/L — ABNORMAL HIGH (ref 0–37)
Albumin: 5.3 g/dL — ABNORMAL HIGH (ref 3.5–5.2)
Alkaline Phosphatase: 52 U/L (ref 39–117)
Bilirubin, Direct: 0.1 mg/dL (ref 0.0–0.3)
Total Bilirubin: 0.7 mg/dL (ref 0.2–1.2)
Total Protein: 8.7 g/dL — ABNORMAL HIGH (ref 6.0–8.3)

## 2021-06-27 LAB — IBC + FERRITIN
Ferritin: 195.4 ng/mL (ref 22.0–322.0)
Iron: 78 ug/dL (ref 42–165)
Saturation Ratios: 18 % — ABNORMAL LOW (ref 20.0–50.0)
TIBC: 434 ug/dL (ref 250.0–450.0)
Transferrin: 310 mg/dL (ref 212.0–360.0)

## 2021-06-27 LAB — LIPASE: Lipase: 71 U/L — ABNORMAL HIGH (ref 11.0–59.0)

## 2021-06-27 LAB — AMYLASE: Amylase: 99 U/L (ref 27–131)

## 2021-06-27 NOTE — Patient Instructions (Signed)

## 2021-06-27 NOTE — Progress Notes (Signed)
? ?Subjective:  ?Patient ID: [name], male    DOB: [birth year]  Age: 31 y.o. MRN: [id number] ? ?CC: Abdominal Pain and Annual Exam ? ?This visit occurred during the SARS-CoV-2 public health emergency.  Safety protocols were in place, including screening questions prior to the visit, additional usage of staff PPE, and extensive cleaning of exam room while observing appropriate contact time as indicated for disinfecting solutions.   ? ?HPI ?[name] presents for a CPX and to establish. ? ?He has felt poorly for 3 years.  He has a worsening sensation of feeling cold all the time, fatigue, poor appetite, weight loss, and a few episodes of bright red blood per rectum.  He has diffuse lower abdominal pain that he describes as intermittent cramping with alternating diarrhea and constipation. ? ?History ?[name] has a past medical history of Jaw snapping (06/2011), Nasal septal deviation (06/2011), and Runny nose (06/24/2011).  ? ?He has a past surgical history that includes Wisdom tooth extraction (2012); Mastectomy, partial (03/22/2011); and Septoplasty (07/01/2011).  ? ?His family history includes Cancer in his maternal uncle.He reports that he quit smoking about 10 years ago. He has never used smokeless tobacco. He reports current alcohol use of about 3.0 standard drinks per week. He reports that he does not use drugs. ? ?No outpatient medications prior to visit.  ? ?No facility-administered medications prior to visit.  ? ? ?ROS ?Review of Systems  ?Constitutional:  Positive for appetite change, fatigue and unexpected weight change. Negative for activity change, chills, diaphoresis and fever.  ?HENT: Negative.  Negative for trouble swallowing.   ?Eyes: Negative.   ?Respiratory: Negative.  Negative for cough, chest tightness, shortness of breath and wheezing.   ?Cardiovascular:  Negative for chest pain, palpitations and leg swelling.  ?Gastrointestinal:  Positive for abdominal pain, blood in stool,  constipation and diarrhea. Negative for abdominal distention, anal bleeding, nausea, rectal pain and vomiting.  ?Endocrine: Positive for cold intolerance. Negative for heat intolerance.  ?Genitourinary: Negative.  Negative for penile swelling, scrotal swelling and testicular pain.  ?Musculoskeletal: Negative.  Negative for arthralgias and myalgias.  ?Skin:  Negative for color change and pallor.  ?Neurological: Negative.  Negative for weakness and light-headedness.  ?Hematological:  Negative for adenopathy. Does not bruise/bleed easily.  ?Psychiatric/Behavioral: Negative.    ? ?Objective:  ?BP 116/70   Pulse 72   Temp 97.8 ?F (36.6 ?C) (Oral)   Ht 5' 10"  (1.778 m)   Wt 164 lb 3.2 oz (74.5 kg)   SpO2 100%   BMI 23.56 kg/m?  ? ?Physical Exam ?Vitals reviewed.  ?Constitutional:   ?   General: He is not in acute distress. ?   Appearance: He is underweight. He is ill-appearing. He is not toxic-appearing or diaphoretic.  ?HENT:  ?   Nose: Nose normal.  ?   Mouth/Throat:  ?   Mouth: Mucous membranes are moist. Mucous membranes are pale.  ?Eyes:  ?   General: No scleral icterus. ?   Conjunctiva/sclera: Conjunctivae normal.  ?Cardiovascular:  ?   Rate and Rhythm: Normal rate and regular rhythm.  ?   Heart sounds: No murmur heard. ?Pulmonary:  ?   Effort: Pulmonary effort is normal.  ?   Breath sounds: No stridor. No wheezing, rhonchi or rales.  ?Abdominal:  ?   General: Abdomen is flat.  ?   Palpations: There is no mass.  ?   Tenderness: There is no abdominal tenderness. There is no guarding or rebound.  ?  Hernia: No hernia is present. There is no hernia in the left inguinal area or right inguinal area.  ?Genitourinary: ?   Pubic Area: No rash.   ?   Penis: Normal and circumcised.   ?   Testes: Normal.  ?   Epididymis:  ?   Right: Normal.  ?   Left: Normal.  ?   Prostate: Normal. Not enlarged, not tender and no nodules present.  ?   Rectum: Normal. Guaiac result negative. No mass, tenderness, anal fissure, external  hemorrhoid or internal hemorrhoid. Normal anal tone.  ?Musculoskeletal:     ?   General: Normal range of motion.  ?   Cervical back: Neck supple.  ?   Right lower leg: No edema.  ?   Left lower leg: No edema.  ?Lymphadenopathy:  ?   Cervical: No cervical adenopathy.  ?   Lower Body: No right inguinal adenopathy. No left inguinal adenopathy.  ?Skin: ?   General: Skin is warm and dry.  ?   Coloration: Skin is pale.  ?Neurological:  ?   General: No focal deficit present.  ?   Mental Status: He is alert. Mental status is at baseline.  ?Psychiatric:     ?   Mood and Affect: Mood normal.     ?   Behavior: Behavior normal.  ? ? ?Lab Results  ?Component Value Date  ? WBC 5.6 06/27/2021  ? HGB 12.8 (L) 06/27/2021  ? HCT 36.6 (L) 06/27/2021  ? PLT 204.0 06/27/2021  ? GLUCOSE 87 06/27/2021  ? CHOL 279 (H) 06/27/2021  ? TRIG 164.0 (H) 06/27/2021  ? HDL 89.20 06/27/2021  ? LDLCALC 157 (H) 06/27/2021  ? ALT 131 (H) 06/27/2021  ? AST 90 (H) 06/27/2021  ? NA 137 06/27/2021  ? K 3.8 06/27/2021  ? CL 98 06/27/2021  ? CREATININE 1.47 06/27/2021  ? BUN 15 06/27/2021  ? CO2 30 06/27/2021  ? TSH >150.00 (H) 06/27/2021  ?  ? ?Assessment & Plan:  ? ?[name] was seen today for abdominal pain and annual exam. ? ?Diagnoses and all orders for this visit: ? ?Cold intolerance- He has hypothyroidism and anemia. ?-     Basic metabolic panel; Future ?-     IBC + Ferritin; Future ?-     Thyroid Panel With TSH; Future ?-     Thyroid Panel With TSH ?-     IBC + Ferritin ?-     Basic metabolic panel ? ?Encounter for general adult medical examination with abnormal findings- Exam completed, labs reviewed, vaccines reviewed and updated, no cancer screenings indicated. ?-     Lipid panel; Future ?-     HIV Antibody (routine testing w rflx); Future ?-     Hepatitis C antibody; Future ?-     Hepatitis C antibody ?-     HIV Antibody (routine testing w rflx) ?-     Lipid panel ? ?Lower abdominal pain- He has a myriad of abdominal symptoms, hypercalcemia,  anemia, and elevated liver enzymes.  I recommended that he undergo an MRI of the abdomen to see if there is a malignant process. ?-     Urinalysis, Routine w reflex microscopic; Future ?-     Hepatic function panel; Future ?-     CBC with Differential/Platelet; Future ?-     Amylase; Future ?-     Lipase; Future ?-     Lipase ?-     Amylase ?-     CBC with  Differential/Platelet ?-     Hepatic function panel ?-     Urinalysis, Routine w reflex microscopic ?-     MR Abdomen W Wo Contrast; Future ? ?Need for hepatitis C screening test ?-     Hepatitis C antibody; Future ?-     Hepatitis C antibody ? ?Deficiency anemia- Will screen for vitamin deficiencies. ?-     Vitamin B1; Future ?-     Folate; Future ?-     Vitamin B12; Future ?-     Zinc; Future ? ?Hypercalcemia- Will treat the vitamin D deficiency and will screen for hyperparathyroidism. ?-     VITAMIN D 25 Hydroxy (Vit-D Deficiency, Fractures); Future ?-     PTH, intact and calcium; Future ?-     Phosphorus; Future ?-     MR Abdomen W Wo Contrast; Future ? ?Elevated lipase ?-     MR Abdomen W Wo Contrast; Future ? ?Elevated LFTs ?-     MR Abdomen W Wo Contrast; Future ? ?Acquired hypothyroidism- I think this explains many of his symptoms.  I recommended that he start taking T4. ?-     levothyroxine (SYNTHROID) 100 MCG tablet; Take 1 tablet (100 mcg total) by mouth daily. ? ?Vitamin D deficiency disease ?-     Cholecalciferol 50 MCG (2000 UT) TABS; Take 1 tablet (2,000 Units total) by mouth daily. ? ? ?I am having Shawn Carattini. Pelly start on levothyroxine and Cholecalciferol. ? ?Meds ordered this encounter  ?Medications  ? levothyroxine (SYNTHROID) 100 MCG tablet  ?  Sig: Take 1 tablet (100 mcg total) by mouth daily.  ?  Dispense:  90 tablet  ?  Refill:  0  ? Cholecalciferol 50 MCG (2000 UT) TABS  ?  Sig: Take 1 tablet (2,000 Units total) by mouth daily.  ?  Dispense:  90 tablet  ?  Refill:  1  ? ? ? ?Follow-up: Return in about 3 months (around 09/27/2021). ? ?Scarlette Calico, MD ?

## 2021-06-28 ENCOUNTER — Other Ambulatory Visit (INDEPENDENT_AMBULATORY_CARE_PROVIDER_SITE_OTHER): Payer: 59

## 2021-06-28 DIAGNOSIS — R748 Abnormal levels of other serum enzymes: Secondary | ICD-10-CM | POA: Insufficient documentation

## 2021-06-28 DIAGNOSIS — D539 Nutritional anemia, unspecified: Secondary | ICD-10-CM | POA: Diagnosis not present

## 2021-06-28 DIAGNOSIS — E559 Vitamin D deficiency, unspecified: Secondary | ICD-10-CM | POA: Insufficient documentation

## 2021-06-28 DIAGNOSIS — R7989 Other specified abnormal findings of blood chemistry: Secondary | ICD-10-CM | POA: Insufficient documentation

## 2021-06-28 DIAGNOSIS — E039 Hypothyroidism, unspecified: Secondary | ICD-10-CM | POA: Insufficient documentation

## 2021-06-28 LAB — HEPATITIS C ANTIBODY
Hepatitis C Ab: NONREACTIVE
SIGNAL TO CUT-OFF: 0.05 (ref <1.00)

## 2021-06-28 LAB — THYROID PANEL WITH TSH
T3 Uptake: 21 % — ABNORMAL LOW (ref 22–35)
T4, Total: <0.7 ug/dL — ABNORMAL LOW (ref 4.9–10.5)
TSH: >150 mIU/L — ABNORMAL HIGH (ref 0.40–4.50)

## 2021-06-28 LAB — FOLATE: Folate: 21 ng/mL (ref >5.9)

## 2021-06-28 LAB — PHOSPHORUS: Phosphorus: 3.1 mg/dL (ref 2.3–4.6)

## 2021-06-28 LAB — HIV ANTIBODY (ROUTINE TESTING W REFLEX): HIV 1&2 Ab, 4th Generation: NONREACTIVE

## 2021-06-28 LAB — VITAMIN B12: Vitamin B-12: 742 pg/mL (ref 211–911)

## 2021-06-28 LAB — VITAMIN D 25 HYDROXY (VIT D DEFICIENCY, FRACTURES): VITD: 27.42 ng/mL — ABNORMAL LOW (ref 30.00–100.00)

## 2021-06-28 MED ORDER — LEVOTHYROXINE SODIUM 100 MCG PO TABS
100.0000 ug | ORAL_TABLET | Freq: Every day | ORAL | 0 refills | Status: DC
Start: 2021-06-28 — End: 2021-09-21

## 2021-06-28 MED ORDER — CHOLECALCIFEROL 50 MCG (2000 UT) PO TABS: 1.0000 | ORAL_TABLET | Freq: Every day | ORAL | 1 refills | Status: DC

## 2021-06-30 ENCOUNTER — Encounter: Payer: Self-pay | Admitting: Internal Medicine

## 2021-06-30 LAB — PTH, INTACT AND CALCIUM
Calcium: 7.1 mg/dL — ABNORMAL LOW (ref 8.6–10.3)
PTH: 52 pg/mL (ref 16–77)

## 2021-07-01 LAB — ZINC: Zinc: 80 ug/dL (ref 60–130)

## 2021-07-01 LAB — VITAMIN B1: Vitamin B1 (Thiamine): 10 nmol/L (ref 8–30)

## 2021-07-16 ENCOUNTER — Other Ambulatory Visit: Payer: Self-pay

## 2021-07-16 ENCOUNTER — Ambulatory Visit
Admission: RE | Admit: 2021-07-16 | Discharge: 2021-07-16 | Disposition: A | Discharge status: Home / Self Care | Payer: 59 | Source: Ambulatory Visit | Attending: Internal Medicine | Admitting: Internal Medicine

## 2021-07-16 DIAGNOSIS — R7989 Other specified abnormal findings of blood chemistry: Secondary | ICD-10-CM

## 2021-07-16 DIAGNOSIS — R748 Abnormal levels of other serum enzymes: Secondary | ICD-10-CM

## 2021-07-16 DIAGNOSIS — R1030 Lower abdominal pain, unspecified: Secondary | ICD-10-CM

## 2021-07-16 MED ORDER — GADOBENATE DIMEGLUMINE 529 MG/ML IV SOLN
15.0000 mL | Freq: Once | INTRAVENOUS | Status: AC | PRN
  Administered 2021-07-16: 15 mL via INTRAVENOUS

## 2021-09-21 ENCOUNTER — Other Ambulatory Visit: Payer: Self-pay | Admitting: Internal Medicine

## 2021-09-21 DIAGNOSIS — E039 Hypothyroidism, unspecified: Secondary | ICD-10-CM

## 2021-09-26 ENCOUNTER — Encounter: Payer: Self-pay | Admitting: Internal Medicine

## 2021-09-26 ENCOUNTER — Ambulatory Visit (INDEPENDENT_AMBULATORY_CARE_PROVIDER_SITE_OTHER): Payer: 59 | Admitting: Internal Medicine

## 2021-09-26 DIAGNOSIS — E039 Hypothyroidism, unspecified: Secondary | ICD-10-CM | POA: Diagnosis not present

## 2021-09-26 DIAGNOSIS — R7989 Other specified abnormal findings of blood chemistry: Secondary | ICD-10-CM | POA: Diagnosis not present

## 2021-09-26 DIAGNOSIS — E559 Vitamin D deficiency, unspecified: Secondary | ICD-10-CM

## 2021-09-26 LAB — TSH: TSH: 34.05 u[IU]/mL — ABNORMAL HIGH (ref 0.35–5.50)

## 2021-09-26 LAB — BASIC METABOLIC PANEL
BUN: 15 mg/dL (ref 6–23)
CO2: 29 mEq/L (ref 19–32)
Calcium: 10.1 mg/dL (ref 8.4–10.5)
Chloride: 101 mEq/L (ref 96–112)
Creatinine, Ser: 1.1 mg/dL (ref 0.40–1.50)
GFR: 89.81 mL/min (ref >60.00)
Glucose, Bld: 97 mg/dL (ref 70–99)
Potassium: 5.2 mEq/L — ABNORMAL HIGH (ref 3.5–5.1)
Sodium: 137 mEq/L (ref 135–145)

## 2021-09-26 LAB — CBC WITH DIFFERENTIAL/PLATELET
Basophils Absolute: 0.1 10*3/uL (ref 0.0–0.1)
Basophils Relative: 1.2 % (ref 0.0–3.0)
Eosinophils Absolute: 0.1 10*3/uL (ref 0.0–0.7)
Eosinophils Relative: 1.1 % (ref 0.0–5.0)
HCT: 43.1 % (ref 39.0–52.0)
Hemoglobin: 14.7 g/dL (ref 13.0–17.0)
Lymphocytes Relative: 29.9 % (ref 12.0–46.0)
Lymphs Abs: 1.4 10*3/uL (ref 0.7–4.0)
MCHC: 34.1 g/dL (ref 30.0–36.0)
MCV: 88.9 fl (ref 78.0–100.0)
Monocytes Absolute: 0.5 10*3/uL (ref 0.1–1.0)
Monocytes Relative: 10.3 % (ref 3.0–12.0)
Neutro Abs: 2.7 10*3/uL (ref 1.4–7.7)
Neutrophils Relative %: 57.5 % (ref 43.0–77.0)
Platelets: 229 10*3/uL (ref 150.0–400.0)
RBC: 4.85 Mil/uL (ref 4.22–5.81)
RDW: 12.6 % (ref 11.5–15.5)
WBC: 4.8 10*3/uL (ref 4.0–10.5)

## 2021-09-26 LAB — HEPATIC FUNCTION PANEL
ALT: 15 U/L (ref 0–53)
AST: 19 U/L (ref 0–37)
Albumin: 4.7 g/dL (ref 3.5–5.2)
Alkaline Phosphatase: 66 U/L (ref 39–117)
Bilirubin, Direct: 0.1 mg/dL (ref 0.0–0.3)
Total Bilirubin: 0.7 mg/dL (ref 0.2–1.2)
Total Protein: 7.8 g/dL (ref 6.0–8.3)

## 2021-09-26 LAB — VITAMIN D 25 HYDROXY (VIT D DEFICIENCY, FRACTURES): VITD: 41.09 ng/mL (ref 30.00–100.00)

## 2021-09-26 MED ORDER — LEVOTHYROXINE SODIUM 150 MCG PO TABS: 150.0000 ug | ORAL_TABLET | Freq: Every day | ORAL | 0 refills | Status: DC

## 2021-09-26 NOTE — Progress Notes (Signed)
Subjective:  Patient ID: [name], male    DOB: 02-08-1991  Age: 31 y.o. MRN: [id number]  CC: Hypothyroidism   HPI [name] presents for f/up -  He feels better since starting T4.  His bowel movements are normal.  He denies abdominal pain, edema, fatigue, or trouble sleeping.  Outpatient Medications Prior to Visit  Medication Sig Dispense Refill   levothyroxine (SYNTHROID) 100 MCG tablet TAKE 1 TABLET(100 MCG) BY MOUTH DAILY 90 tablet 0   Cholecalciferol 50 MCG (2000 UT) TABS Take 1 tablet (2,000 Units total) by mouth daily. 90 tablet 1   No facility-administered medications prior to visit.    ROS Review of Systems  Constitutional: Negative.  Negative for diaphoresis and fatigue.  HENT: Negative.    Eyes: Negative.   Respiratory:  Negative for cough, chest tightness, shortness of breath and wheezing.   Cardiovascular:  Negative for chest pain, palpitations and leg swelling.  Gastrointestinal:  Negative for abdominal pain, constipation, diarrhea, nausea and vomiting.  Endocrine: Negative.  Negative for cold intolerance and heat intolerance.  Genitourinary: Negative.  Negative for difficulty urinating.  Musculoskeletal: Negative.  Negative for myalgias.  Skin: Negative.   Neurological:  Negative for dizziness, weakness and headaches.  Hematological:  Negative for adenopathy. Does not bruise/bleed easily.  Psychiatric/Behavioral: Negative.      Objective:  BP 106/68 (BP Location: Left Arm, Patient Position: Sitting, Cuff Size: Large)   Pulse 60   Temp 97.9 F (36.6 C) (Oral)   Ht 5\' 10"  (1.778 m)   Wt 156 lb (70.8 kg)   SpO2 99%   BMI 22.38 kg/m   BP Readings from Last 3 Encounters:  09/26/21 106/68  06/27/21 116/70  02/20/15 100/68    Wt Readings from Last 3 Encounters:  09/26/21 156 lb (70.8 kg)  06/27/21 164 lb 3.2 oz (74.5 kg)  02/20/15 146 lb 12.8 oz (66.6 kg)    Physical Exam Vitals reviewed.  HENT:     Nose: Nose normal.      Mouth/Throat:     Mouth: Mucous membranes are moist.  Eyes:     General: No scleral icterus.    Conjunctiva/sclera: Conjunctivae normal.  Cardiovascular:     Rate and Rhythm: Normal rate and regular rhythm.     Heart sounds: No murmur heard. Pulmonary:     Effort: Pulmonary effort is normal.     Breath sounds: No stridor. No wheezing, rhonchi or rales.  Abdominal:     General: Abdomen is flat.     Palpations: There is no mass.     Tenderness: There is no abdominal tenderness. There is no guarding.     Hernia: No hernia is present.  Musculoskeletal:     Cervical back: Neck supple.     Right lower leg: No edema.     Left lower leg: No edema.  Lymphadenopathy:     Cervical: No cervical adenopathy.  Skin:    General: Skin is warm and dry.  Neurological:     General: No focal deficit present.     Mental Status: He is alert.  Psychiatric:        Mood and Affect: Mood normal.        Behavior: Behavior normal.     Lab Results  Component Value Date   WBC 4.8 09/26/2021   HGB 14.7 09/26/2021   HCT 43.1 09/26/2021   PLT 229.0 09/26/2021   GLUCOSE 97 09/26/2021   CHOL 279 (H) 06/27/2021   TRIG  164.0 (H) 06/27/2021   HDL 89.20 06/27/2021   LDLCALC 157 (H) 06/27/2021   ALT 15 09/26/2021   AST 19 09/26/2021   NA 137 09/26/2021   K 5.2 No hemolysis seen (H) 09/26/2021   CL 101 09/26/2021   CREATININE 1.10 09/26/2021   BUN 15 09/26/2021   CO2 29 09/26/2021   TSH 34.05 (H) 09/26/2021    MR Abdomen W Wo Contrast  Result Date: 07/17/2021 CLINICAL DATA:  Abdominal pain EXAM: MRI ABDOMEN WITHOUT AND WITH CONTRAST TECHNIQUE: Multiplanar multisequence MR imaging of the abdomen was performed both before and after the administration of intravenous contrast. CONTRAST:  58mL MULTIHANCE GADOBENATE DIMEGLUMINE 529 MG/ML IV SOLN COMPARISON:  None. FINDINGS: Lower chest: No acute findings. Hepatobiliary: Liver is normal in size and contour. The hepatic parenchyma demonstrates diffuse  hypointense T2 signal with mild relatively increased signal on out of phase T1 which could be seen with hemosiderosis. There is a 7 mm hyperintense T2 signal lesion in the right hepatic lobe segment 7 which appears to progressively fill with contrast, likely a small hemangioma. Gallbladder appears normal. No biliary ductal dilatation. Pancreas: No mass, inflammatory changes, or other parenchymal abnormality identified. Spleen:  Normal size with hypointense T2 signal parenchyma. Adrenals/Urinary Tract: No masses identified. No evidence of hydronephrosis. Stomach/Bowel: Visualized portions within the abdomen are unremarkable. Vascular/Lymphatic: No pathologically enlarged lymph nodes identified. No abdominal aortic aneurysm demonstrated. Other:  No ascites. Musculoskeletal: No suspicious bone lesions identified. IMPRESSION: 1. No acute abnormality identified. 2. Parenchymal signal characteristics of the liver and spleen can be seen with hemosiderosis, correlate clinically. 3. Subcentimeter hemangioma in the right hepatic lobe. Electronically Signed   By: Jannifer Hick M.D.   On: 07/17/2021 14:01    Assessment & Plan:   Ashely was seen today for hypothyroidism.  Diagnoses and all orders for this visit:  Hypercalcemia- His calcium is normal now. -     Basic metabolic panel; Future -     VITAMIN D 25 Hydroxy (Vit-D Deficiency, Fractures); Future -     VITAMIN D 25 Hydroxy (Vit-D Deficiency, Fractures) -     Basic metabolic panel  Acquired hypothyroidism- His TSH is lower but still too high.  I will increase his T4 dosage. -     CBC with Differential/Platelet; Future -     TSH; Future -     Hepatic function panel; Future -     Hepatic function panel -     TSH -     CBC with Differential/Platelet -     Discontinue: levothyroxine (SYNTHROID) 150 MCG tablet; Take 1 tablet (150 mcg total) by mouth daily. -     Discontinue: levothyroxine (SYNTHROID) 200 MCG tablet; Take 1 tablet (200 mcg total) by  mouth daily before breakfast.  Vitamin D deficiency disease- Vitamin D is normal now. -     VITAMIN D 25 Hydroxy (Vit-D Deficiency, Fractures); Future -     VITAMIN D 25 Hydroxy (Vit-D Deficiency, Fractures)  Elevated LFTs- Liver enzymes are normal now. -     Hepatic function panel; Future -     Hepatic function panel   I have discontinued Janyth Pupa E. Pinho "[name]"'s Cholecalciferol, levothyroxine, and levothyroxine.  Meds ordered this encounter  Medications   DISCONTD: levothyroxine (SYNTHROID) 150 MCG tablet    Sig: Take 1 tablet (150 mcg total) by mouth daily.    Dispense:  90 tablet    Refill:  0   DISCONTD: levothyroxine (SYNTHROID) 200 MCG tablet    Sig:  Take 1 tablet (200 mcg total) by mouth daily before breakfast.    Dispense:  90 tablet    Refill:  0     Follow-up: Return in about 6 months (around 03/28/2022).  Sanda Lingerhomas Claryce Friel, MD

## 2021-09-26 NOTE — Patient Instructions (Signed)

## 2021-09-27 MED ORDER — LEVOTHYROXINE SODIUM 200 MCG PO TABS: 200.0000 ug | ORAL_TABLET | Freq: Every day | ORAL | 0 refills | Status: DC

## 2021-09-28 ENCOUNTER — Other Ambulatory Visit: Payer: Self-pay | Admitting: Internal Medicine

## 2021-09-28 ENCOUNTER — Telehealth: Payer: Self-pay | Admitting: Internal Medicine

## 2021-09-28 DIAGNOSIS — E039 Hypothyroidism, unspecified: Secondary | ICD-10-CM

## 2021-09-28 MED ORDER — LEVOTHYROXINE SODIUM 150 MCG PO TABS: 150.0000 ug | ORAL_TABLET | Freq: Every day | ORAL | 0 refills | Status: DC

## 2021-09-28 NOTE — Telephone Encounter (Signed)
Patient would like to drop his thyroid medication to 150 mcg.  He is not feeling back but he feels like this is too aggressive of a jump from 100 mcg.

## 2021-12-28 ENCOUNTER — Other Ambulatory Visit: Payer: Self-pay | Admitting: Internal Medicine

## 2021-12-28 ENCOUNTER — Telehealth: Payer: Self-pay

## 2021-12-28 DIAGNOSIS — E039 Hypothyroidism, unspecified: Secondary | ICD-10-CM

## 2021-12-28 NOTE — Telephone Encounter (Signed)
TSH lab has been ordered.

## 2021-12-28 NOTE — Telephone Encounter (Signed)
Pt called back in to schedule appt for labs since increasing the dose of the thyroid supplement. However I do not see a future date order for it to be completed.  Pt is scheduled to come Monday 12/31/21 at 1:00pm  Please advise

## 2021-12-31 ENCOUNTER — Other Ambulatory Visit (INDEPENDENT_AMBULATORY_CARE_PROVIDER_SITE_OTHER): Payer: 59

## 2021-12-31 DIAGNOSIS — E039 Hypothyroidism, unspecified: Secondary | ICD-10-CM

## 2021-12-31 LAB — TSH: TSH: 0.86 u[IU]/mL (ref 0.35–5.50)

## 2022-01-04 LAB — THYROTROPIN RECEPTOR AUTOABS

## 2022-01-04 LAB — SPECIMEN STATUS REPORT

## 2022-03-28 ENCOUNTER — Ambulatory Visit: Payer: Self-pay | Admitting: Internal Medicine

## 2022-03-28 ENCOUNTER — Encounter: Payer: Self-pay | Admitting: Internal Medicine

## 2022-03-28 VITALS — BP 120/78 | HR 73 | Temp 98.1°F | Ht 70.0 in | Wt 158.0 lb

## 2022-03-28 DIAGNOSIS — E039 Hypothyroidism, unspecified: Secondary | ICD-10-CM

## 2022-03-28 LAB — BASIC METABOLIC PANEL
BUN: 14 mg/dL (ref 6–23)
CO2: 31 mEq/L (ref 19–32)
Calcium: 10 mg/dL (ref 8.4–10.5)
Chloride: 103 mEq/L (ref 96–112)
Creatinine, Ser: 1.13 mg/dL (ref 0.40–1.50)
GFR: 86.65 mL/min (ref >60.00)
Glucose, Bld: 64 mg/dL — ABNORMAL LOW (ref 70–99)
Potassium: 4.2 mEq/L (ref 3.5–5.1)
Sodium: 141 mEq/L (ref 135–145)

## 2022-03-28 LAB — TSH: TSH: 27.51 u[IU]/mL — ABNORMAL HIGH (ref 0.35–5.50)

## 2022-03-28 MED ORDER — LEVOTHYROXINE SODIUM 200 MCG PO TABS: 200.0000 ug | ORAL_TABLET | Freq: Every day | ORAL | 0 refills | Status: DC

## 2022-03-28 NOTE — Progress Notes (Signed)
Subjective:  Patient ID: [name], male    DOB: [birth year]  Age: 31 y.o. MRN: [id number]  CC: Hypothyroidism   HPI [name] presents for f/up -  He feels well.  Outpatient Medications Prior to Visit  Medication Sig Dispense Refill   levothyroxine (SYNTHROID) 150 MCG tablet Take 1 tablet (150 mcg total) by mouth daily. 90 tablet 0   No facility-administered medications prior to visit.    ROS Review of Systems  Constitutional:  Negative for diaphoresis and fatigue.  HENT: Negative.    Eyes: Negative.   Respiratory:  Negative for cough, chest tightness, shortness of breath and wheezing.   Cardiovascular:  Negative for chest pain, palpitations and leg swelling.  Gastrointestinal:  Negative for abdominal pain, constipation, diarrhea, nausea and vomiting.  Endocrine: Negative.   Genitourinary: Negative.  Negative for difficulty urinating.  Musculoskeletal: Negative.  Negative for arthralgias and myalgias.  Skin: Negative.   Neurological: Negative.  Negative for dizziness and weakness.  Hematological:  Negative for adenopathy. Does not bruise/bleed easily.  Psychiatric/Behavioral: Negative.      Objective:  BP 120/78 (BP Location: Left Arm, Patient Position: Sitting, Cuff Size: Large)   Pulse 73   Temp 98.1 F (36.7 C) (Oral)   Ht 5\' 10"  (1.778 m)   Wt 158 lb (71.7 kg)   SpO2 98%   BMI 22.67 kg/m   BP Readings from Last 3 Encounters:  03/28/22 120/78  09/26/21 106/68  06/27/21 116/70    Wt Readings from Last 3 Encounters:  03/28/22 158 lb (71.7 kg)  09/26/21 156 lb (70.8 kg)  06/27/21 164 lb 3.2 oz (74.5 kg)    Physical Exam Vitals reviewed.  HENT:     Mouth/Throat:     Mouth: Mucous membranes are moist.  Eyes:     General: No scleral icterus.    Conjunctiva/sclera: Conjunctivae normal.  Neck:     Thyroid: No thyroid mass, thyromegaly or thyroid tenderness.  Cardiovascular:     Rate and Rhythm: Normal rate and regular rhythm.      Heart sounds: No murmur heard. Pulmonary:     Effort: Pulmonary effort is normal.     Breath sounds: No stridor. No wheezing, rhonchi or rales.  Abdominal:     General: Abdomen is flat.     Tenderness: There is no abdominal tenderness. There is no guarding or rebound.     Hernia: No hernia is present.  Musculoskeletal:        General: Normal range of motion.     Cervical back: Neck supple.     Right lower leg: No edema.  Skin:    General: Skin is warm and dry.  Neurological:     General: No focal deficit present.     Mental Status: Mental status is at baseline.  Psychiatric:        Mood and Affect: Mood normal.        Behavior: Behavior normal.     Lab Results  Component Value Date   WBC 4.8 09/26/2021   HGB 14.7 09/26/2021   HCT 43.1 09/26/2021   PLT 229.0 09/26/2021   GLUCOSE 64 (L) 03/28/2022   CHOL 279 (H) 06/27/2021   TRIG 164.0 (H) 06/27/2021   HDL 89.20 06/27/2021   LDLCALC 157 (H) 06/27/2021   ALT 15 09/26/2021   AST 19 09/26/2021   NA 141 03/28/2022   K 4.2 03/28/2022   CL 103 03/28/2022   CREATININE 1.13 03/28/2022   BUN 14 03/28/2022  CO2 31 03/28/2022   TSH 27.51 (H) 03/28/2022    MR Abdomen W Wo Contrast  Result Date: 07/17/2021 CLINICAL DATA:  Abdominal pain EXAM: MRI ABDOMEN WITHOUT AND WITH CONTRAST TECHNIQUE: Multiplanar multisequence MR imaging of the abdomen was performed both before and after the administration of intravenous contrast. CONTRAST:  53mL MULTIHANCE GADOBENATE DIMEGLUMINE 529 MG/ML IV SOLN COMPARISON:  None. FINDINGS: Lower chest: No acute findings. Hepatobiliary: Liver is normal in size and contour. The hepatic parenchyma demonstrates diffuse hypointense T2 signal with mild relatively increased signal on out of phase T1 which could be seen with hemosiderosis. There is a 7 mm hyperintense T2 signal lesion in the right hepatic lobe segment 7 which appears to progressively fill with contrast, likely a small hemangioma. Gallbladder  appears normal. No biliary ductal dilatation. Pancreas: No mass, inflammatory changes, or other parenchymal abnormality identified. Spleen:  Normal size with hypointense T2 signal parenchyma. Adrenals/Urinary Tract: No masses identified. No evidence of hydronephrosis. Stomach/Bowel: Visualized portions within the abdomen are unremarkable. Vascular/Lymphatic: No pathologically enlarged lymph nodes identified. No abdominal aortic aneurysm demonstrated. Other:  No ascites. Musculoskeletal: No suspicious bone lesions identified. IMPRESSION: 1. No acute abnormality identified. 2. Parenchymal signal characteristics of the liver and spleen can be seen with hemosiderosis, correlate clinically. 3. Subcentimeter hemangioma in the right hepatic lobe. Electronically Signed   By: Jannifer Hick M.D.   On: 07/17/2021 14:01    Assessment & Plan:   [name] was seen today for hypothyroidism.  Diagnoses and all orders for this visit:  Acquired hypothyroidism- His T4 dose is subtherapeutic.  Will increase to 200 mcg. -     TSH; Future -     Basic metabolic panel; Future -     Basic metabolic panel -     TSH -     levothyroxine (SYNTHROID) 200 MCG tablet; Take 1 tablet (200 mcg total) by mouth daily.   I have discontinued [name]. Colin "Nick"'s levothyroxine. I am also having him start on levothyroxine.  Meds ordered this encounter  Medications   levothyroxine (SYNTHROID) 200 MCG tablet    Sig: Take 1 tablet (200 mcg total) by mouth daily.    Dispense:  90 tablet    Refill:  0     Follow-up: No follow-ups on file.  Sanda Linger, MD

## 2022-06-22 ENCOUNTER — Other Ambulatory Visit: Payer: Self-pay | Admitting: Internal Medicine

## 2022-06-22 DIAGNOSIS — E039 Hypothyroidism, unspecified: Secondary | ICD-10-CM

## 2022-08-23 ENCOUNTER — Other Ambulatory Visit: Payer: Self-pay

## 2022-08-23 ENCOUNTER — Encounter (HOSPITAL_COMMUNITY): Payer: Self-pay

## 2022-08-23 ENCOUNTER — Emergency Department (HOSPITAL_COMMUNITY)
Admission: EM | Admit: 2022-08-23 | Discharge: 2022-08-23 | Disposition: A | Discharge status: Home / Self Care | Payer: BC Managed Care – PPO | Attending: Emergency Medicine | Admitting: Emergency Medicine

## 2022-08-23 ENCOUNTER — Emergency Department (HOSPITAL_COMMUNITY): Payer: BC Managed Care – PPO

## 2022-08-23 DIAGNOSIS — E039 Hypothyroidism, unspecified: Secondary | ICD-10-CM | POA: Insufficient documentation

## 2022-08-23 DIAGNOSIS — R0789 Other chest pain: Secondary | ICD-10-CM | POA: Diagnosis not present

## 2022-08-23 DIAGNOSIS — R079 Chest pain, unspecified: Secondary | ICD-10-CM | POA: Diagnosis not present

## 2022-08-23 DIAGNOSIS — R946 Abnormal results of thyroid function studies: Secondary | ICD-10-CM | POA: Diagnosis not present

## 2022-08-23 DIAGNOSIS — R7989 Other specified abnormal findings of blood chemistry: Secondary | ICD-10-CM

## 2022-08-23 DIAGNOSIS — Z79899 Other long term (current) drug therapy: Secondary | ICD-10-CM | POA: Insufficient documentation

## 2022-08-23 HISTORY — DX: Hypothyroidism, unspecified: E03.9

## 2022-08-23 LAB — CBC
HCT: 48.6 % (ref 39.0–52.0)
Hemoglobin: 16.5 g/dL (ref 13.0–17.0)
MCH: 28.5 pg (ref 26.0–34.0)
MCHC: 34 g/dL (ref 30.0–36.0)
MCV: 84.1 fL (ref 80.0–100.0)
Platelets: 254 10*3/uL (ref 150–400)
RBC: 5.78 MIL/uL (ref 4.22–5.81)
RDW: 12.3 % (ref 11.5–15.5)
WBC: 5.7 10*3/uL (ref 4.0–10.5)
nRBC: 0 % (ref 0.0–0.2)

## 2022-08-23 LAB — BASIC METABOLIC PANEL
Anion gap: 9 (ref 5–15)
BUN: 14 mg/dL (ref 6–20)
CO2: 25 mmol/L (ref 22–32)
Calcium: 9.9 mg/dL (ref 8.9–10.3)
Chloride: 102 mmol/L (ref 98–111)
Creatinine, Ser: 0.92 mg/dL (ref 0.61–1.24)
GFR, Estimated: >60 mL/min (ref >60)
Glucose, Bld: 107 mg/dL — ABNORMAL HIGH (ref 70–99)
Potassium: 4.3 mmol/L (ref 3.5–5.1)
Sodium: 136 mmol/L (ref 135–145)

## 2022-08-23 LAB — TSH: TSH: 0.012 u[IU]/mL — ABNORMAL LOW (ref 0.350–4.500)

## 2022-08-23 LAB — T4, FREE: Free T4: 1.99 ng/dL — ABNORMAL HIGH (ref 0.61–1.12)

## 2022-08-23 LAB — TROPONIN I (HIGH SENSITIVITY)
Troponin I (High Sensitivity): <2 ng/L (ref <18)
Troponin I (High Sensitivity): <2 ng/L (ref <18)

## 2022-08-23 NOTE — ED Notes (Signed)
Nurse responded to call light, found pt resting in bed. Pt stated he had to use the restroom and requested to be disconnected from monitor. Pt was disconnected and ambulated to restroom without complications.

## 2022-08-23 NOTE — ED Provider Notes (Signed)
 [place] EMERGENCY DEPARTMENT AT [facility] Provider Note   CSN: [id number] Arrival date & time: 08/23/22  1241     History Chief Complaint  Patient presents with   Chest Pain    [name] is a 32 y.o. male.  Patient presents to the emergency department complaints of chest pain.  He reports that he has been experiencing some right-sided chest pain with radiation into the right neck.  He reports that he previously had this episode occur 1 time which subsided on its own.  He attempted to take some stomach acid medication but this did not help alleviate his symptoms at that time.  He denies any associated symptoms such as shortness of breath, nausea, diaphoresis, headache with the presentation of the chest pain.  Reports the chest pain is mild in nature but is concerned given that this is not the second time this is happening.  Patient has a prior history significant for hypothyroidism and takes levothyroxine 100 mcg.  Has not had thyroid labs checked recently.   Chest Pain      Home Medications Prior to Admission medications   Medication Sig Start Date End Date Taking? Authorizing Provider  levothyroxine (SYNTHROID) 200 MCG tablet TAKE 1 TABLET(200 MCG) BY MOUTH DAILY 06/22/22   Etta Grandchild, MD      Allergies    Penicillins    Review of Systems   Review of Systems  Cardiovascular:  Positive for chest pain.    Physical Exam Updated Vital Signs BP 122/74   Pulse 64   Temp 97.8 F (36.6 C) (Oral)   Resp 18   Ht 5\' 10"  (1.778 m)   Wt 70.3 kg   SpO2 99%   BMI 22.24 kg/m  Physical Exam Vitals and nursing note reviewed.  Constitutional:      General: He is not in acute distress.    Appearance: He is well-developed.  HENT:     Head: Normocephalic and atraumatic.  Eyes:     Conjunctiva/sclera: Conjunctivae normal.  Cardiovascular:     Rate and Rhythm: Normal rate and regular rhythm.     Heart sounds: No murmur heard. Pulmonary:     Effort:  Pulmonary effort is normal. No respiratory distress.     Breath sounds: Normal breath sounds.  Abdominal:     Palpations: Abdomen is soft.     Tenderness: There is no abdominal tenderness.  Musculoskeletal:        General: No swelling.     Cervical back: Neck supple.  Skin:    General: Skin is warm and dry.     Capillary Refill: Capillary refill takes less than 2 seconds.  Neurological:     Mental Status: He is alert.  Psychiatric:        Mood and Affect: Mood normal.     ED Results / Procedures / Treatments   Labs (all labs ordered are listed, but only abnormal results are displayed) Labs Reviewed  BASIC METABOLIC PANEL - Abnormal; Notable for the following components:      Result Value   Glucose, Bld 107 (*)    All other components within normal limits  TSH - Abnormal; Notable for the following components:   TSH 0.012 (*)    All other components within normal limits  CBC  T3, FREE  T4, FREE  TROPONIN I (HIGH SENSITIVITY)  TROPONIN I (HIGH SENSITIVITY)    EKG EKG Interpretation  Date/Time:  Friday Aug 23 2022 12:55:11 EDT Ventricular Rate:  64 PR Interval:  146 QRS Duration: 91 QT Interval:  388 QTC Calculation: 401 R Axis:   48 Text Interpretation: Sinus rhythm No old tracing to compare Confirmed by Linwood Dibbles [id number]) on 08/23/2022 1:13:19 PM  Radiology DG Chest 2 View  Result Date: 08/23/2022 CLINICAL DATA:  Chest pain. EXAM: CHEST - 2 VIEW COMPARISON:  June 29, 2010. FINDINGS: The heart size and mediastinal contours are within normal limits. Both lungs are clear. The visualized skeletal structures are unremarkable. IMPRESSION: No active cardiopulmonary disease. Electronically Signed   By: Lupita Raider M.D.   On: 08/23/2022 13:10    Procedures Procedures   Medications Ordered in ED Medications - No data to display  ED Course/ Medical Decision Making/ A&P                           Medical Decision Making Amount and/or Complexity of Data Reviewed Labs:  ordered. Radiology: ordered.   This patient presents to the ED for concern of chest pain.  Differential diagnosis includes MI, pneumonia, pneumothorax, thyroid storm   Lab Tests:  I Ordered, and personally interpreted labs.  The pertinent results include: CBC, BMP, troponin unremarkable.  TSH low at 0.012.  Free T3 and free T4 still pending.   Imaging Studies ordered:  I ordered imaging studies including chest x-ray, ultrasound right carotid I independently visualized and interpreted imaging which showed no acute cardiopulmonary normality, I agree with the radiologist interpretation   Problem List / ED Course:  Patient presents emergency department complaints of chest pain.  Reports that his chest pain happened while he was at rest.  He reports that the pain was right-sided with radiating up into the right side of his neck.  He reports that this type of episode happened previously about 3 weeks prior without specific inciting event.  He reports he tried taking some antiacid medication which did not improve his symptoms.  Lab workup initiated for chest pain which thankfully was reassuring without any signs of acute ischemia present as his EKG was normal and troponin is negative.  Unclear etiology of patient's chest pain at this time.  Advised patient that this may be related to thyroid level being significantly decreased at this time.  This could also be due to other causes that would benefit from cardiology evaluation to assure this is not cardiac in nature.  Advised patient that a referral to cardiology will be made.  Also encourage patient to follow-up with primary care provider as free T3 and free T4 labs are not back yet to make any recommendations on home medication of levothyroxine at this time.  Given lack of true hyperthyroid symptoms, I am not convinced that patient's decreased TSH level is resulting in clinical manifestations at this time.  Encourage patient to speak with primary care  provider about these results for further modification of treatment if needed.  Patient is agreeable with this treatment plan verbalized understanding all return precautions.  All questions answered prior to patient discharge.  Final Clinical Impression(s) / ED Diagnoses Final diagnoses:  Other chest pain  Decreased thyroid stimulating hormone (TSH) level    Rx / DC Orders ED Discharge Orders          Ordered    Ambulatory referral to Cardiology        08/23/22 1714              Smitty Knudsen, PA-C 08/23/22 1719  Linwood Dibbles, MD 08/25/22 Corky Crafts

## 2022-08-23 NOTE — ED Notes (Signed)
Pt has returned back from the restroom. Pt was reconnected to monitor. Pt resting with spouse at bedside.

## 2022-08-23 NOTE — ED Triage Notes (Signed)
Patient has had centralized chest pain that radiates up to the right side of his jaw for the past 20 minutes. Never has had a blood clot before. Had the same thing happen a couple weeks ago, took tums, pain went away.

## 2022-08-23 NOTE — Discharge Instructions (Signed)
You are seen in the emergency department today for chest pain.  Thankfully your lab workup and imaging was all reassuring with no evidence of any sign of any heart strain or stress.  Your EKG was also normal.  Your TSH level was considerably low which may be secondary to being on the levothyroxine.  Unfortunately as the free T4 and free T3 levels have not resulted yet, I cannot make a clear recommendation or diagnosis on what this finding might show.  I would recommend following up with her primary care provider for further evaluation as the results likely be available at that time for further adjustments to medications if needed.  If you have any worsening of your symptoms please return to the emergency department.  Otherwise, please follow-up with her primary care provider for further evaluation.  A referral was made to cardiology for evaluation given that there is no clear source of your chest pain that you are experiencing.

## 2022-08-24 LAB — T3, FREE: T3, Free: 4.3 pg/mL (ref 2.0–4.4)

## 2022-09-13 DIAGNOSIS — R7989 Other specified abnormal findings of blood chemistry: Secondary | ICD-10-CM | POA: Diagnosis not present

## 2022-09-13 DIAGNOSIS — E559 Vitamin D deficiency, unspecified: Secondary | ICD-10-CM | POA: Diagnosis not present

## 2022-09-13 DIAGNOSIS — E782 Mixed hyperlipidemia: Secondary | ICD-10-CM | POA: Diagnosis not present

## 2022-09-13 DIAGNOSIS — E039 Hypothyroidism, unspecified: Secondary | ICD-10-CM | POA: Diagnosis not present

## 2022-10-05 ENCOUNTER — Other Ambulatory Visit: Payer: Self-pay | Admitting: Internal Medicine

## 2022-10-05 DIAGNOSIS — E039 Hypothyroidism, unspecified: Secondary | ICD-10-CM

## 2022-10-11 ENCOUNTER — Ambulatory Visit: Payer: BC Managed Care – PPO | Attending: Cardiovascular Disease | Admitting: Cardiovascular Disease

## 2022-10-11 ENCOUNTER — Encounter: Payer: Self-pay | Admitting: Cardiovascular Disease

## 2023-01-24 DIAGNOSIS — E039 Hypothyroidism, unspecified: Secondary | ICD-10-CM | POA: Diagnosis not present

## 2023-02-12 DIAGNOSIS — L718 Other rosacea: Secondary | ICD-10-CM | POA: Diagnosis not present

## 2023-04-30 DIAGNOSIS — E039 Hypothyroidism, unspecified: Secondary | ICD-10-CM | POA: Diagnosis not present

## 2023-04-30 DIAGNOSIS — R7989 Other specified abnormal findings of blood chemistry: Secondary | ICD-10-CM | POA: Diagnosis not present

## 2023-04-30 DIAGNOSIS — E782 Mixed hyperlipidemia: Secondary | ICD-10-CM | POA: Diagnosis not present

## 2023-04-30 DIAGNOSIS — E559 Vitamin D deficiency, unspecified: Secondary | ICD-10-CM | POA: Diagnosis not present

## 2023-10-12 DIAGNOSIS — S60221A Contusion of right hand, initial encounter: Secondary | ICD-10-CM | POA: Diagnosis not present

## 2024-01-06 IMAGING — MR MR ABDOMEN WO/W CM
11 of 17 series · 28 of 48 positions shown · IV contrast (multihance)
Comparison: None.

CLINICAL DATA: Abdominal pain

EXAM:
MRI ABDOMEN WITHOUT AND WITH CONTRAST
TECHNIQUE: Multiplanar multisequence MR imaging of the abdomen was performed
both before and after the administration of intravenous contrast.
CONTRAST:  15mL MULTIHANCE GADOBENATE DIMEGLUMINE 529 MG/ML IV SOLN

[Series 3: cor haste · coronal · 5.0mm · 0.68mm/px · 2 of 31 slices shown]
[im 1/31]
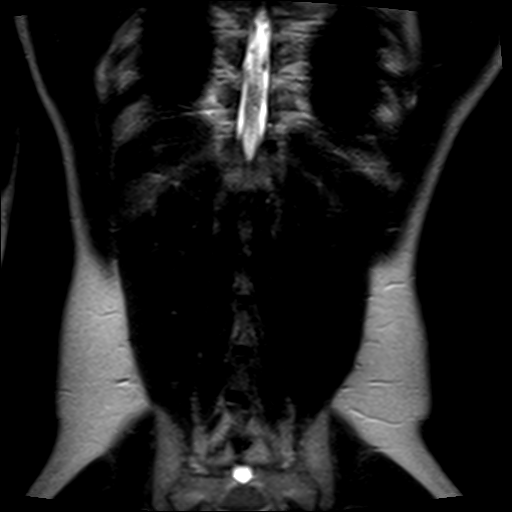
[im 31/31]
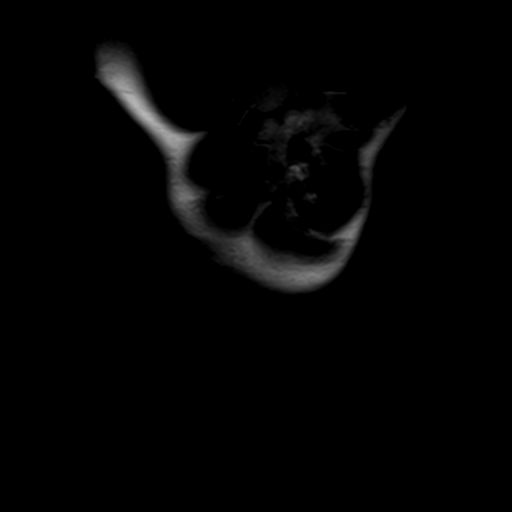

[Series 4: axial haste · axial · 6.0mm · 0.68mm/px · z∈[-72,+126]mm · 2 of 31 slices shown]
[im 1/31]
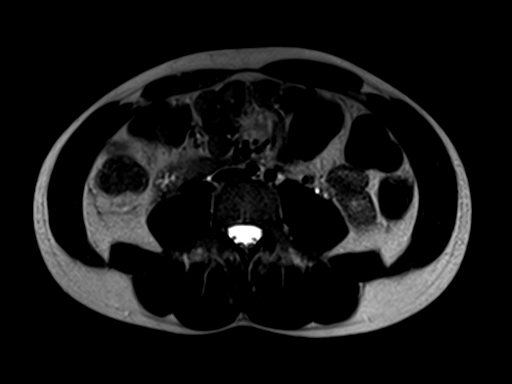
[im 31/31]
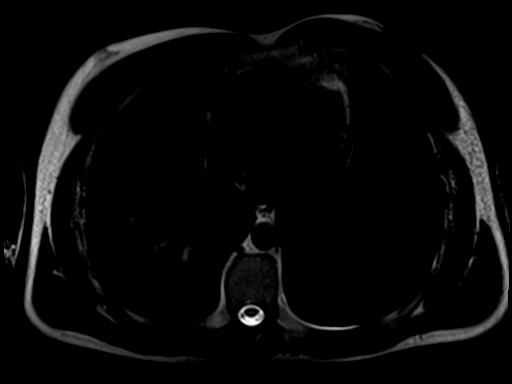

[Series 5: T1 · axial · 6.0mm · 0.68mm/px · z∈[-72,+126]mm · 4 of 62 slices shown]
[im 1/62]
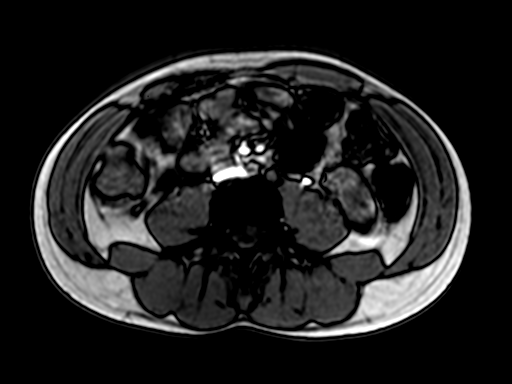
[im 21/62]
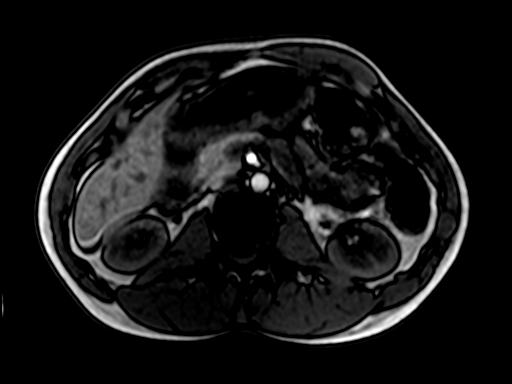
[im 41/62]
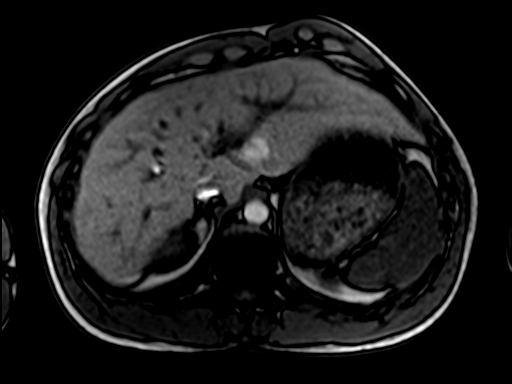
[im 62/62]
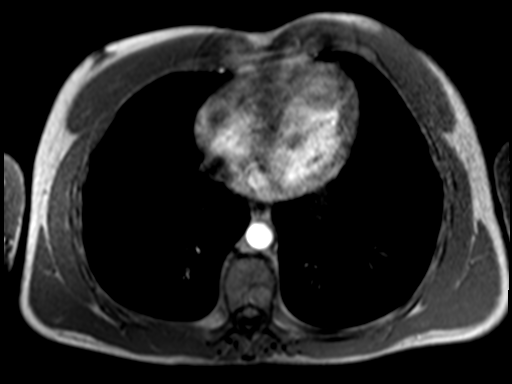

[Series 6: bSSFP · axial · 4.0mm · 0.68mm/px · z∈[-71,+117]mm · 3 of 48 slices shown]
[im 1/48]
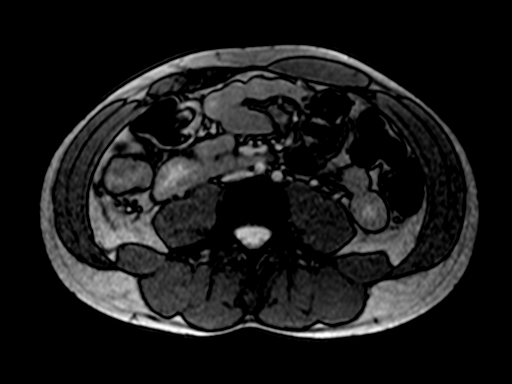
[im 24/48]
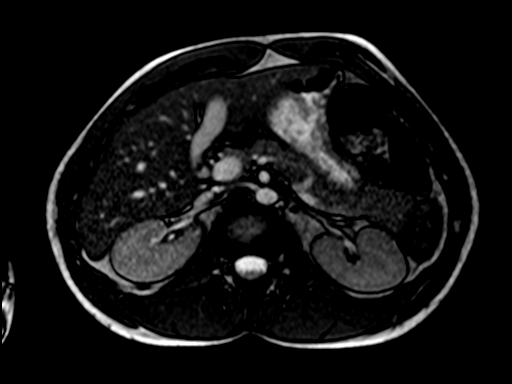
[im 48/48]
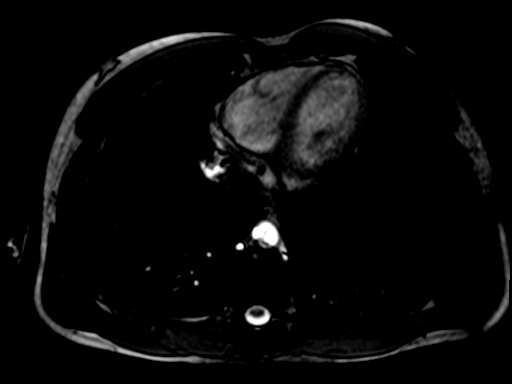

[Series 7: T2 fat-sat · axial · 6.0mm · 1.09mm/px · z∈[-81,+142]mm · 2 of 32 slices shown]
[im 1/32]
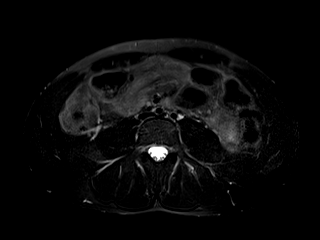
[im 32/32]
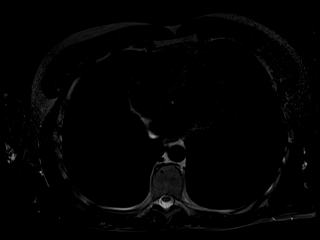

[Series 8: [id number]_trig · axial · 6.0mm · 1.82mm/px · z∈[-81,+142]mm · 4 of 96 slices shown]
[im 1/96]
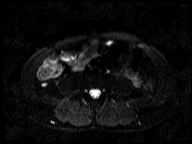
[im 32/96]
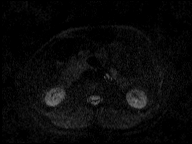
[im 64/96]
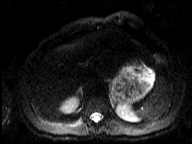
[im 96/96]
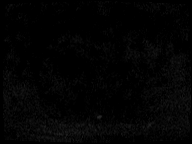

[Series 9: [id number]_trig_adc · axial · 6.0mm · 1.82mm/px · 1 of 32 slices shown]
[im 1/32]
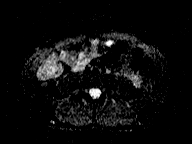

[Series 10: T1 dynamic · axial · non-contrast · 2.5mm · 0.74mm/px · z∈[-72,+126]mm · 3 of 80 slices shown]
[im 1/80]
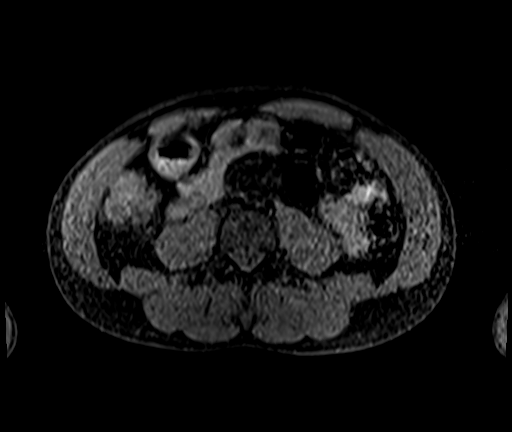
[im 40/80]
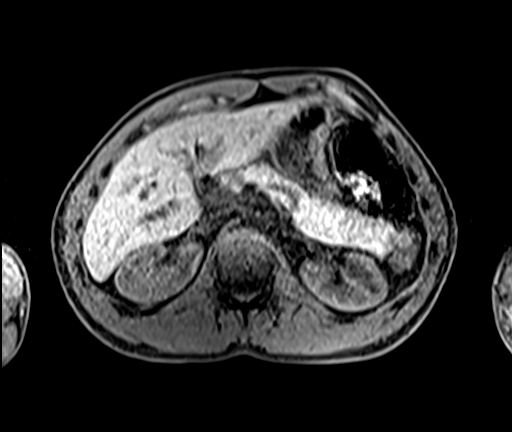
[im 80/80]
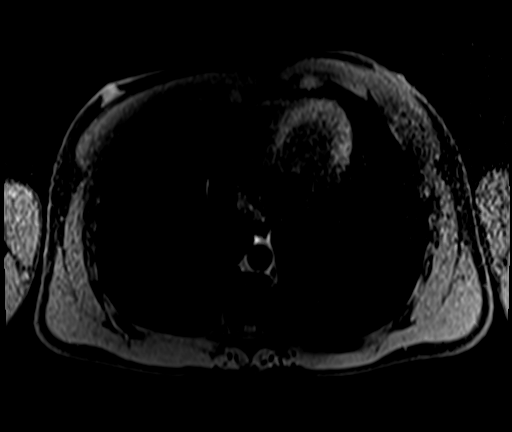

[Series 11: T1 dynamic post-contrast · axial · 2.5mm · 0.74mm/px · z∈[-72,+126]mm · 3 of 80 slices shown (1 of 3)]
[im 1/80]
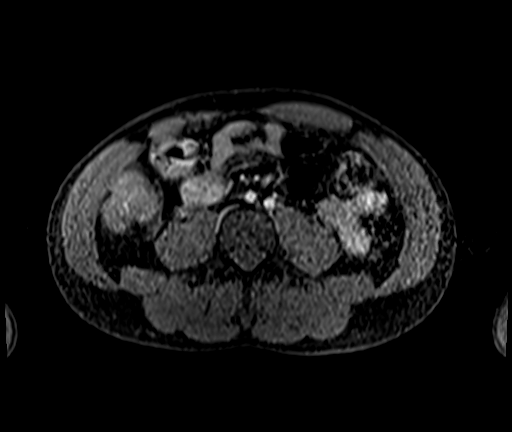
[im 40/80]
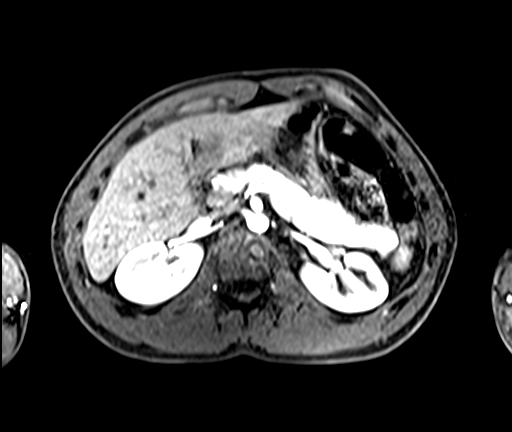
[im 80/80]
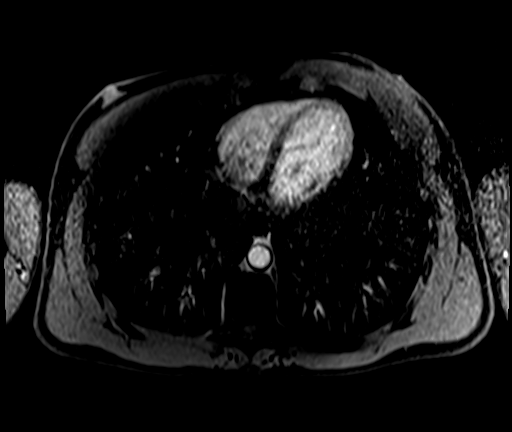

[Series 12: T1 dynamic post-contrast · axial · 2.5mm · 0.74mm/px · z∈[-72,+126]mm · 3 of 80 slices shown (2 of 3)]
[im 1/80]
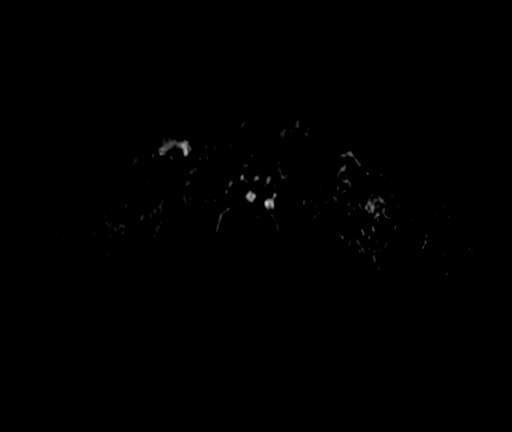
[im 40/80]
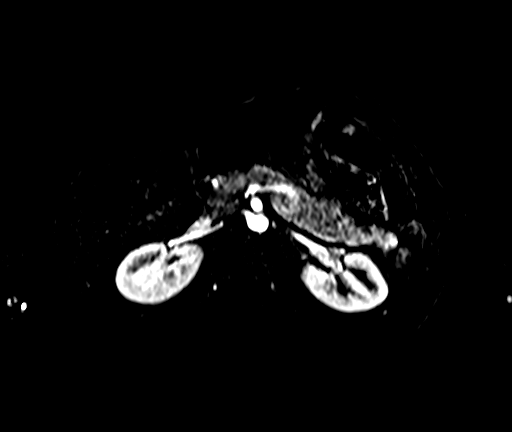
[im 80/80]
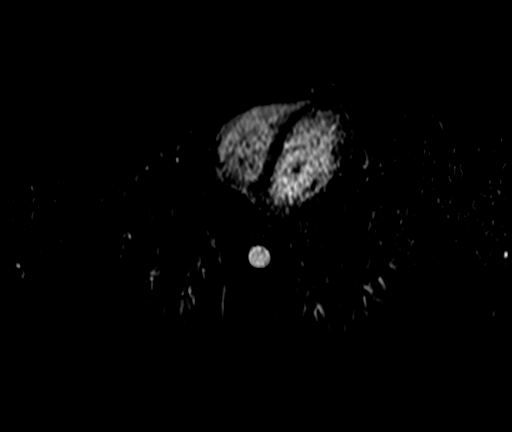

[Series 13: T1 dynamic post-contrast · axial · 2.5mm · 0.74mm/px · 1 of 80 slices shown (3 of 3)]
[im 1/80]
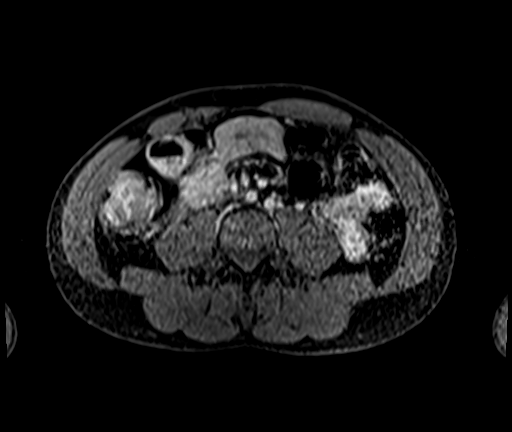

[28 of 48 positions shown; findings below may reference images not displayed]

FINDINGS: Lower chest: No acute findings.

Hepatobiliary: Liver is normal in size and contour. The hepatic
parenchyma demonstrates diffuse hypointense T2 signal with mild
relatively increased signal on out of phase T1 which could be seen
with hemosiderosis. There is a 7 mm hyperintense T2 signal lesion in
the right hepatic lobe segment 7 which appears to progressively fill
with contrast, likely a small hemangioma. Gallbladder appears
normal. No biliary ductal dilatation.

Pancreas: No mass, inflammatory changes, or other parenchymal
abnormality identified.

Spleen:  Normal size with hypointense T2 signal parenchyma.

Adrenals/Urinary Tract: No masses identified. No evidence of
hydronephrosis.

Stomach/Bowel: Visualized portions within the abdomen are
unremarkable.

Vascular/Lymphatic: No pathologically enlarged lymph nodes
identified. No abdominal aortic aneurysm demonstrated.

Other:  No ascites.

Musculoskeletal: No suspicious bone lesions identified.
IMPRESSION: 1. No acute abnormality identified.
2. Parenchymal signal characteristics of the liver and spleen can be
seen with hemosiderosis, correlate clinically.
3. Subcentimeter hemangioma in the right hepatic lobe.
